# Patient Record
Sex: Male | Born: 2000 | Race: White | Hispanic: No | Marital: Single | Smoking: Never smoker
Health system: Southern US, Community
[De-identification: ages and names within clinical notes are randomized; demographics above are authoritative.]

## PROBLEM LIST (undated history)

## (undated) DIAGNOSIS — J302 Other seasonal allergic rhinitis: Secondary | ICD-10-CM

---
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
## 2019-02-25 ENCOUNTER — Other Ambulatory Visit: Payer: Self-pay

## 2019-02-25 ENCOUNTER — Ambulatory Visit (INDEPENDENT_AMBULATORY_CARE_PROVIDER_SITE_OTHER): Payer: Medicaid Other | Admitting: Family Medicine

## 2019-02-25 ENCOUNTER — Encounter: Payer: Self-pay | Admitting: Family Medicine

## 2019-02-25 DIAGNOSIS — L0501 Pilonidal cyst with abscess: Secondary | ICD-10-CM | POA: Diagnosis not present

## 2019-02-25 MED ORDER — AMOXICILLIN-POT CLAVULANATE 875-125 MG PO TABS: 1.0000 | ORAL_TABLET | Freq: Two times a day (BID) | ORAL | 0 refills | Status: DC

## 2019-02-25 NOTE — Patient Instructions (Signed)
Pilonidal Cyst  A pilonidal cyst is a fluid-filled sac that forms beneath the skin near the tailbone, at the top of the crease of the buttocks (pilonidal area). If the cyst is not large and not infected, it may not cause any problems. If the cyst becomes irritated or infected, it may get larger and fill with pus. An infected cyst is called an abscess. A pilonidal abscess may cause pain and swelling, and it may need to be drained or removed. What are the causes? The cause of this condition is not always known. In some cases, a hair that grows into your skin (ingrown hair) may be the cause. What increases the risk? You are more likely to get a pilonidal cyst if you:  Are male.  Have lots of hair near the crease of the buttocks.  Are overweight.  Have a dimple near the crease of the buttocks.  Wear tight clothing.  Do not bathe or shower often.  Sit for long periods of time. What are the signs or symptoms? Signs and symptoms of a pilonidal cyst may include pain, swelling, redness, and warmth in the pilonidal area. Depending on how big the cyst is, you may be able to feel a lump near your tailbone. If your cyst becomes infected, symptoms may include:  Pus or fluid drainage.  Fever.  Pain, swelling, and redness getting worse.  The lump getting bigger. How is this diagnosed? This condition may be diagnosed based on:  Your symptoms and medical history.  A physical exam.  A blood test to check for infection.  Testing a pus sample, if applicable. How is this treated? If your cyst does not cause symptoms, you may not need any treatment. If your cyst bothers you or is infected, you may need a procedure to drain or remove the cyst. Depending on the size, location, and severity of your cyst, your health care provider may:  Make an incision in the cyst and drain it (incision and drainage).  Open and drain the cyst, and then stitch the wound so that it stays open while it heals  (marsupialization). You will be given instructions about how to care for your open wound while it heals.  Remove all or part of the cyst, and then close the wound (cyst removal). You may need to take antibiotic medicines before your procedure. Follow these instructions at home: Medicines  Take over-the-counter and prescription medicines only as told by your health care provider.  If you were prescribed an antibiotic medicine, take it as told by your health care provider. Do not stop taking the antibiotic even if you start to feel better. General instructions  Keep the area around your pilonidal cyst clean and dry.  If there is fluid or pus draining from your cyst: ? Cover the area with a clean bandage (dressing) as needed. ? Wash the area gently with soap and water. Pat the area dry with a clean towel. Do not rub the area because that may cause bleeding.  Remove hair from the area around the cyst only if your health care provider tells you to do this.  Do not wear tight pants or sit in one position for long periods at a time.  Keep all follow-up visits as told by your health care provider. This is important. Contact a health care provider if you have:  New redness, swelling, or pain.  A fever.  Severe pain. Summary  A pilonidal cyst is a fluid-filled sac that forms beneath the   skin near the tailbone, at the top of the crease of the buttocks (pilonidal area).  If the cyst becomes irritated or infected, it may get larger and fill with pus. An infected cyst is called an abscess.  The cause of this condition is not always known. In some cases, a hair that grows into your skin (ingrown hair) may be the cause.  If your cyst does not cause symptoms, you may not need any treatment. If your cyst bothers you or is infected, you may need a procedure to drain or remove the cyst. This information is not intended to replace advice given to you by your health care provider. Make sure you  discuss any questions you have with your health care provider. Document Released: 06/10/2000 Document Revised: 06/01/2017 Document Reviewed: 06/01/2017 Elsevier Patient Education  2020 Elsevier Inc.  

## 2019-02-25 NOTE — Progress Notes (Signed)
   Subjective:    Patient ID: Anthony Wu, male    DOB: Jun 26, 2001, 18 y.o.   MRN: [id number] Initially virtual visit After discussion on the phone it was apparent the patient would need an office visit to help address this HPI  Patient calls with cysts at tailbone irritated for several days-not large in size per patient. Patient states it has become painful and hurts to sit on it.  Patient states this area has become gradually larger more painful and discomforting.  He states it has had a little bit of drainage earlier today.  He is never had this before.  Denies any fever chills sweats denies any COVID symptoms Virtual Visit via Video Note  I connected with Anthony Wu on 02/25/19 at 11:00 AM EDT by a video enabled telemedicine application and verified that I am speaking with the correct person using two identifiers.  Location: Patient: home Provider: office   I discussed the limitations of evaluation and management by telemedicine and the availability of in person appointments. The patient expressed understanding and agreed to proceed.  History of Present Illness:    Observations/Objective:   Assessment and Plan:   Follow Up Instructions:    I discussed the assessment and treatment plan with the patient. The patient was provided an opportunity to ask questions and all were answered. The patient agreed with the plan and demonstrated an understanding of the instructions.   The patient was advised to call back or seek an in-person evaluation if the symptoms worsen or if the condition fails to improve as anticipated.  I provided 25 minutes of non-face-to-face time during this encounter.        Review of Systems  Constitutional: Negative for activity change.  HENT: Negative for congestion and rhinorrhea.   Respiratory: Negative for cough and shortness of breath.   Cardiovascular: Negative for chest pain.  Gastrointestinal: Negative for abdominal pain, diarrhea,  nausea and vomiting.  Genitourinary: Negative for dysuria and hematuria.  Neurological: Negative for weakness and headaches.  Psychiatric/Behavioral: Negative for behavioral problems and confusion.   Complains of pain in the upper tailbone region    Objective:   Physical Exam Lungs are clear respiratory rate normal heart regular no murmurs abdomen soft no guarding or rebound Lower tailbone region has pilonidal cyst with small area of drainage  This was discussed with the patient he agrees and gives verbal consent to drainage of this  The area was anesthetized with 1% lidocaine with epinephrine Area was prepped with Betadine #11 blade was used Approximately 1 cm cut was made into the area that had fluctuance with a small drainage hole A large amount of pus was obtained No bleeding complications Packing was placed with a bandage       Assessment & Plan:  Pilonidal cyst Drainage was achieved with I&D today Augmentin 875 mg twice daily for the next 10 days Will work toward getting patient in with surgeon Warning signs discussed if high fevers progressive symptoms are worse follow-up immediately

## 2019-02-26 ENCOUNTER — Other Ambulatory Visit: Payer: Self-pay

## 2019-02-26 ENCOUNTER — Encounter (HOSPITAL_COMMUNITY): Payer: Self-pay | Admitting: *Deleted

## 2019-02-26 ENCOUNTER — Ambulatory Visit: Payer: Medicaid Other | Admitting: Family Medicine

## 2019-02-26 ENCOUNTER — Emergency Department (HOSPITAL_COMMUNITY)
Admission: EM | Admit: 2019-02-26 | Discharge: 2019-02-26 | Disposition: A | Discharge status: Home / Self Care | Payer: Medicaid Other | Attending: Emergency Medicine | Admitting: Emergency Medicine

## 2019-02-26 ENCOUNTER — Emergency Department (HOSPITAL_COMMUNITY): Payer: Medicaid Other

## 2019-02-26 ENCOUNTER — Telehealth: Payer: Self-pay | Admitting: Family Medicine

## 2019-02-26 DIAGNOSIS — J988 Other specified respiratory disorders: Secondary | ICD-10-CM | POA: Diagnosis not present

## 2019-02-26 DIAGNOSIS — Z7722 Contact with and (suspected) exposure to environmental tobacco smoke (acute) (chronic): Secondary | ICD-10-CM | POA: Diagnosis not present

## 2019-02-26 DIAGNOSIS — R509 Fever, unspecified: Secondary | ICD-10-CM | POA: Diagnosis not present

## 2019-02-26 DIAGNOSIS — Z79899 Other long term (current) drug therapy: Secondary | ICD-10-CM | POA: Diagnosis not present

## 2019-02-26 DIAGNOSIS — B9789 Other viral agents as the cause of diseases classified elsewhere: Secondary | ICD-10-CM | POA: Diagnosis not present

## 2019-02-26 DIAGNOSIS — J069 Acute upper respiratory infection, unspecified: Secondary | ICD-10-CM | POA: Diagnosis not present

## 2019-02-26 DIAGNOSIS — Z20828 Contact with and (suspected) exposure to other viral communicable diseases: Secondary | ICD-10-CM | POA: Insufficient documentation

## 2019-02-26 DIAGNOSIS — R05 Cough: Secondary | ICD-10-CM | POA: Diagnosis not present

## 2019-02-26 DIAGNOSIS — L0501 Pilonidal cyst with abscess: Secondary | ICD-10-CM | POA: Diagnosis not present

## 2019-02-26 LAB — CBC WITH DIFFERENTIAL/PLATELET
Abs Immature Granulocytes: 0.03 10*3/uL (ref 0.00–0.07)
Basophils Absolute: 0.1 10*3/uL (ref 0.0–0.1)
Basophils Relative: 0 %
Eosinophils Absolute: 0 10*3/uL (ref 0.0–0.5)
Eosinophils Relative: 0 %
HCT: 45.3 % (ref 39.0–52.0)
Hemoglobin: 15 g/dL (ref 13.0–17.0)
Immature Granulocytes: 0 %
Lymphocytes Relative: 14 %
Lymphs Abs: 1.9 10*3/uL (ref 0.7–4.0)
MCH: 30.2 pg (ref 26.0–34.0)
MCHC: 33.1 g/dL (ref 30.0–36.0)
MCV: 91.3 fL (ref 80.0–100.0)
Monocytes Absolute: 1.4 10*3/uL — ABNORMAL HIGH (ref 0.1–1.0)
Monocytes Relative: 11 %
Neutro Abs: 10.2 10*3/uL — ABNORMAL HIGH (ref 1.7–7.7)
Neutrophils Relative %: 75 %
Platelets: 261 10*3/uL (ref 150–400)
RBC: 4.96 MIL/uL (ref 4.22–5.81)
RDW: 12.5 % (ref 11.5–15.5)
WBC: 13.7 10*3/uL — ABNORMAL HIGH (ref 4.0–10.5)
nRBC: 0 % (ref 0.0–0.2)

## 2019-02-26 LAB — SARS CORONAVIRUS 2 BY RT PCR (HOSPITAL ORDER, PERFORMED IN ~~LOC~~ HOSPITAL LAB): SARS Coronavirus 2: NEGATIVE

## 2019-02-26 LAB — BASIC METABOLIC PANEL
Anion gap: 12 (ref 5–15)
BUN: 9 mg/dL (ref 6–20)
CO2: 24 mmol/L (ref 22–32)
Calcium: 9.1 mg/dL (ref 8.9–10.3)
Chloride: 101 mmol/L (ref 98–111)
Creatinine, Ser: 1.25 mg/dL — ABNORMAL HIGH (ref 0.61–1.24)
GFR calc Af Amer: >60 mL/min (ref >60)
GFR calc non Af Amer: >60 mL/min (ref >60)
Glucose, Bld: 92 mg/dL (ref 70–99)
Potassium: 3.7 mmol/L (ref 3.5–5.1)
Sodium: 137 mmol/L (ref 135–145)

## 2019-02-26 LAB — LACTIC ACID, PLASMA: Lactic Acid, Venous: 1.1 mmol/L (ref 0.5–1.9)

## 2019-02-26 MED ORDER — ACETAMINOPHEN 325 MG PO TABS
650.0000 mg | ORAL_TABLET | Freq: Once | ORAL | Status: AC
  Administered 2019-02-26: 650 mg via ORAL
  Filled 2019-02-26: qty 2

## 2019-02-26 MED ORDER — SODIUM CHLORIDE 0.9 % IV BOLUS
1000.0000 mL | Freq: Once | INTRAVENOUS | Status: AC
  Administered 2019-02-26: 1000 mL via INTRAVENOUS

## 2019-02-26 MED ORDER — AZELASTINE HCL 0.1 % NA SOLN: 2.0000 | Freq: Two times a day (BID) | NASAL | 2 refills | Status: DC

## 2019-02-26 NOTE — ED Triage Notes (Signed)
To ED for eval of fever after having an abscess drained (coccyx) yesterday. Today woke with high fever. Told to come to ED for further eval. Pt states area feels much better.

## 2019-02-26 NOTE — Telephone Encounter (Signed)
Patient relates pilonidal cyst feels better Still draining some pus Today start running 102 fever Slight cough Based on the symptoms along with recent drainage of pilonidal cyst referral to [facility] ER Needs evaluation for bacteremia may need IV antibiotics It is also possible this could be early COVID causing the fever may well need testing as well  I did talk with the patient he voices understanding his mother will drive him to the ER for further evaluation

## 2019-02-26 NOTE — Discharge Instructions (Signed)
Please follow-up with your doctor tomorrow for wound check and recheck of your symptoms.  Please return to the emergency department if you develop any new or worsening symptoms including worsening pain, increasing size of your abscess, passing out, difficulty breathing, or any other new or concerning symptoms.

## 2019-02-26 NOTE — Telephone Encounter (Signed)
Refill sent in to requested pharmacy 

## 2019-02-26 NOTE — Telephone Encounter (Signed)
Please see previous documentation on previous phone message  Nurse did call triage at [facility] to let them know patient coming because of 102 fever with recently drained pilonidal cyst as well as possibility of COVID

## 2019-02-26 NOTE — Telephone Encounter (Signed)
Mom contacted office to inform us that pt is now running a fever or 102.3. pt had a boil lanced yesterday. The area is draining; dark brown in color with a little blood. The smell and pain is not as bad as yesterday. Spoke with Dr.Scott; per Dr.Scott put a message in and I will give them a call in about 235-30 minutes. Mom verbalized understanding.

## 2019-02-26 NOTE — Telephone Encounter (Signed)
Patients dad is calling requesting a refill on the azelastine.   Gordon

## 2019-02-26 NOTE — Telephone Encounter (Signed)
I did speak with the mother regarding this and the patient they are going to [facility] ER They will need evaluation for bacteremia Outside chance of developing COVID since young man is started to have a cough today ER will evaluate for both Triage the ER was spoken with by nurse Additional records are within the system

## 2019-02-26 NOTE — ED Provider Notes (Signed)
[facility] EMERGENCY DEPARTMENT Provider Note   CSN: [id number] Arrival date & time: 02/26/19  1558     History   Chief Complaint Chief Complaint  Patient presents with  . Fever    HPI Anthony Wu is a 18 y.o. male.     HPI   18 year old male presents today with complaints of fever.  Patient notes that approximately 4 days ago he developed a pilonidal abscess.  He was seen by his primary care provider who performed I&D in the clinic.  The wound was packed he was placed on antibiotics and sent home.  He notes that today he developed a fever, he called his primary care provider who recommended he come to the emergency room for ongoing evaluation and management.  Patient notes that he also developed a very minor cough yesterday with no associated shortness of breath.  He denies any abdominal pain any rashes, or any other signs of infectious etiology.  He denies any close sick contacts or covert exposures.    Past Medical History:  Diagnosis Date  . Seasonal allergies     Patient Active Problem List   Diagnosis Date Noted  . Allergic rhinitis 10/24/2012    History reviewed. No pertinent surgical history.      Home Medications    Prior to Admission medications   Medication Sig Start Date End Date Taking? Authorizing Provider  amoxicillin-clavulanate (AUGMENTIN) 875-125 MG tablet Take 1 tablet by mouth 2 (two) times daily. 02/25/19   Kathyrn Drown, MD  azelastine (ASTELIN) 0.1 % nasal spray Place 2 sprays into both nostrils 2 (two) times daily. 02/26/19   Valentina Shaggy, MD  cetirizine (ZYRTEC) 10 MG tablet Take 1 tablet (10 mg total) by mouth daily. 01/16/18   Valentina Shaggy, MD  fluticasone [facility]) 50 MCG/ACT nasal spray Place 2 sprays into both nostrils daily. 10/09/17   Kathyrn Drown, MD  montelukast (SINGULAIR) 10 MG tablet Take 1 tablet (10 mg total) by mouth at bedtime. 05/09/18   Valentina Shaggy, MD    Family History  Family History  Problem Relation Age of Onset  . Allergic rhinitis Father   . COPD Father     Social History Social History   Tobacco Use  . Smoking status: Passive Smoke Exposure - Never Smoker  . Smokeless tobacco: Never Used  Substance Use Topics  . Alcohol use: No  . Drug use: No     Allergies   Patient has no known allergies.   Review of Systems Review of Systems  All other systems reviewed and are negative.    Physical Exam Updated Vital Signs BP 130/71 (BP Location: Left Arm)   Pulse 100   Temp (!) 100.6 F (38.1 C) (Oral)   Resp 18   SpO2 97%   Physical Exam Vitals signs and nursing note reviewed.  Constitutional:      Appearance: He is well-developed.  HENT:     Head: Normocephalic and atraumatic.  Eyes:     General: No scleral icterus.       Right eye: No discharge.        Left eye: No discharge.     Conjunctiva/sclera: Conjunctivae normal.     Pupils: Pupils are equal, round, and reactive to light.  Neck:     Musculoskeletal: Normal range of motion.     Vascular: No JVD.     Trachea: No tracheal deviation.  Pulmonary:     Effort: Pulmonary effort is  normal.     Breath sounds: No stridor.  Abdominal:     General: Abdomen is flat. There is no distension.     Palpations: Abdomen is soft.     Tenderness: There is no abdominal tenderness.  Skin:    Comments: 1.5 cm area of induration to the right pilonidal region with packing in place no surrounding erythema no significant tenderness  Neurological:     Mental Status: He is alert and oriented to person, place, and time.     Coordination: Coordination normal.  Psychiatric:        Behavior: Behavior normal.        Thought Content: Thought content normal.        Judgment: Judgment normal.      ED Treatments / Results  Labs (all labs ordered are listed, but only abnormal results are displayed) Labs Reviewed  CBC WITH DIFFERENTIAL/PLATELET - Abnormal; Notable for the following components:       Result Value   WBC 13.7 (*)    Neutro Abs 10.2 (*)    Monocytes Absolute 1.4 (*)    All other components within normal limits  SARS CORONAVIRUS 2 (HOSPITAL ORDER, PERFORMED IN [facility])  CULTURE, BLOOD (ROUTINE X 2)  CULTURE, BLOOD (ROUTINE X 2)  LACTIC ACID, PLASMA  BASIC METABOLIC PANEL  LACTIC ACID, PLASMA    EKG None  Radiology No results found.  Procedures Procedures (including critical care time)  Medications Ordered in ED Medications  acetaminophen (TYLENOL) tablet 650 mg (650 mg Oral Given 02/26/19 1838)  sodium chloride 0.9 % bolus 1,000 mL (1,000 mLs Intravenous New Bag/Given 02/26/19 1838)     Initial Impression / Assessment and Plan / ED Course  I have reviewed the triage vital signs and the nursing notes.  Pertinent labs & imaging results that were available during my care of the patient were reviewed by me and considered in my medical decision making (see chart for details).        18 year old male presents today with fever.  Differential for his fever includes COVID given his new cough elevated fever and overall well appearance.  Also consider bacteremia secondary to pilonidal abscess.  Although I believe the bacteremia is unlikely the source given the very minimal presentation today it is still within the differential.  Given his elevated fever and tachycardia he will be given fluids, he will have a Covid test here.  Patient care signed to oncoming provider pending laboratory analysis COVID testing and disposition.    Final Clinical Impressions(s) / ED Diagnoses   Final diagnoses:  Pilonidal abscess  Viral respiratory illness    ED Discharge Orders    None       Rosalio LoudHedges, Lynnsey Barbara, PA-C 02/26/19 1853    Tegeler, Canary Brimhristopher J, MD 02/27/19 [phone]

## 2019-02-26 NOTE — ED Notes (Signed)
Patient verbalizes understanding of discharge instructions. Opportunity for questioning and answers were provided. Armband removed by staff, pt discharged from ED.  

## 2019-02-26 NOTE — ED Provider Notes (Signed)
Signout from [employer], PA-C at shift change See previous providers note for full H&P  Briefly, patient was seen by his PCP yesterday and had incision and drainage of pilonidal abscess.  He was started on Augmentin.  Patient spiked a fever today and also developed a cough.  He is otherwise feeling pretty well.  He is having some discomfort at the site of the abscess, however it is improving.  Physical Exam  BP 112/61   Pulse 90   Temp (!) 100.6 F (38.1 C) (Oral)   Resp 18   SpO2 99%   Physical Exam Vitals signs and nursing note reviewed.  Constitutional:      General: He is not in acute distress.    Appearance: He is well-developed. He is not diaphoretic.  HENT:     Head: Normocephalic and atraumatic.     Mouth/Throat:     Pharynx: No oropharyngeal exudate.  Eyes:     General: No scleral icterus.       Right eye: No discharge.        Left eye: No discharge.     Conjunctiva/sclera: Conjunctivae normal.     Pupils: Pupils are equal, round, and reactive to light.  Neck:     Musculoskeletal: Normal range of motion and neck supple.     Thyroid: No thyromegaly.  Cardiovascular:     Rate and Rhythm: Normal rate and regular rhythm.     Heart sounds: Normal heart sounds. No murmur. No friction rub. No gallop.   Pulmonary:     Effort: Pulmonary effort is normal. No respiratory distress.     Breath sounds: Normal breath sounds. No stridor. No wheezing or rales.  Abdominal:     General: Bowel sounds are normal. There is no distension.     Palpations: Abdomen is soft.     Tenderness: There is no abdominal tenderness. There is no guarding or rebound.  Lymphadenopathy:     Cervical: No cervical adenopathy.  Skin:    General: Skin is warm and dry.     Coloration: Skin is not pale.     Findings: No rash.     Comments: Incision and drainage site in the pilonidal region with packing; 3 cm area of erythema and induration, mildly tender  Neurological:     Mental Status: He is alert.      Coordination: Coordination normal.     ED Course/Procedures     Procedures  MDM  Patient presenting with fever, cough, and recently drained pilonidal abscess.  Patient does have leukocytosis at 13.7.  Creatinine is elevated at 1.25.  IV fluids given.  Lactic acid is negative.  Blood cultures pending.  COVID-19 test is negative as well as chest x-ray.  Using shared decision making I discussed with patient and his mother the options of close follow-up with PCP versus hospital admission.  Patient would like to go home.  I feel this is reasonable.  He has an appointment with his doctor tomorrow for wound recheck and packing removal.  He is given strict return precautions and advised to increase fluid intake at home.  He and his mother understand and agree with plan.  Patient vital stable throughout ED course and discharged in satisfactory condition. I discussed patient case with Dr. Vanita Panda who guided the patient's management and agrees with plan.        Frederica Kuster, PA-C 02/27/19 1424    Carmin Muskrat, MD 03/01/19 [phone]

## 2019-02-27 ENCOUNTER — Telehealth: Payer: Self-pay | Admitting: Family Medicine

## 2019-02-27 DIAGNOSIS — L0501 Pilonidal cyst with abscess: Secondary | ICD-10-CM

## 2019-02-27 NOTE — Telephone Encounter (Signed)
Pt will need urgent referral before getting an appt.  Dad states that pt is doing a little better today. He is still having a small fever (100 this morning but Dad not sure if thermometer is accurate); pt vomited last night; wound is still draining some. COVID and blood test came back negative but are still waiting on a few blood test that are still running. Please advise. Thank you

## 2019-02-27 NOTE — Telephone Encounter (Signed)
Please give urgent referral may have appointment for next week I would recommend toward the end of next week  Continue to do warm compresses Nurses-I recommend that I do a follow-up visit with the patient tomorrow morning-I have limited hours- with patient running fever I would have to do the visit at outside facility  If unable to come tomorrow morning then I would recommend Friday morning

## 2019-02-27 NOTE — Telephone Encounter (Signed)
Please advise. Thank you

## 2019-02-27 NOTE — Telephone Encounter (Signed)
Dad calling to check on referral to surgeon.  He said test came back negative at ER and the hospital said to continue on with the referral.  I don't see a referral in the system.

## 2019-02-27 NOTE — Telephone Encounter (Signed)
Urgent referral placed to general surgery. Left message to return call

## 2019-02-27 NOTE — Telephone Encounter (Signed)
Please call over to Dr. Fransisca Kaufmann office please give patient a follow-up visit regarding pilonidal cyst  Ideally I would like to have the patient seen this coming week September 8-11 See how the young man is doing currently?  Any fevers today?

## 2019-02-28 ENCOUNTER — Ambulatory Visit (INDEPENDENT_AMBULATORY_CARE_PROVIDER_SITE_OTHER): Payer: Medicaid Other | Admitting: Family Medicine

## 2019-02-28 ENCOUNTER — Other Ambulatory Visit: Payer: Self-pay

## 2019-02-28 DIAGNOSIS — J019 Acute sinusitis, unspecified: Secondary | ICD-10-CM

## 2019-02-28 DIAGNOSIS — Z20822 Contact with and (suspected) exposure to covid-19: Secondary | ICD-10-CM

## 2019-02-28 DIAGNOSIS — R6889 Other general symptoms and signs: Secondary | ICD-10-CM | POA: Diagnosis not present

## 2019-02-28 MED ORDER — ONDANSETRON HCL 8 MG PO TABS: 8.0000 mg | ORAL_TABLET | Freq: Three times a day (TID) | ORAL | 0 refills | Status: DC | PRN

## 2019-02-28 MED ORDER — AZITHROMYCIN 250 MG PO TABS: ORAL_TABLET | ORAL | 0 refills | Status: DC

## 2019-02-28 NOTE — Telephone Encounter (Signed)
Contacted dad. Dad states that patient is still having a fever, chills, diarrhea, and has vomited some. Pt dad transferred up front to set up appt for this morning

## 2019-02-28 NOTE — Progress Notes (Signed)
Patient with significant body aches fever chills sweats cough congestion denies shortness of breath Patient relates diarrhea some nausea no vomiting Intermittent vomiting yesterday last night Able to urinate Does not feel like he is going to pass out Went to the ER the other day because of the fever negative COVID test Packing needs to be removed today On examination lungs are clear heart is regular pulse normal temperature 103 not in respiratory distress distress skin warm dry skin turgor good no sign of any type of vascular compromise at this point Probable COVID infection Z-Pak for sinus symptoms as well as pilonidal cyst Stop Augmentin could be causing some of his GI symptoms Patient to stay isolated for 10 days Recommend repeat COVID testing 15 minutes was spent with patient today discussing healthcare issues which they came.  More than 50% of this visit-total duration of visit-was spent in counseling and coordination of care.  Please see diagnosis regarding the focus of this coordination and care Packing was removed without difficulty pilonidal cyst looks good Proper PPE was worn during this evaluation For safety purposes of other patients outdoor facility was utilized

## 2019-03-01 LAB — NOVEL CORONAVIRUS, NAA: SARS-CoV-2, NAA: NOT DETECTED

## 2019-03-03 LAB — CULTURE, BLOOD (ROUTINE X 2): Culture: NO GROWTH

## 2019-03-19 ENCOUNTER — Ambulatory Visit (INDEPENDENT_AMBULATORY_CARE_PROVIDER_SITE_OTHER): Payer: Medicaid Other | Admitting: General Surgery

## 2019-03-19 ENCOUNTER — Other Ambulatory Visit: Payer: Self-pay

## 2019-03-19 ENCOUNTER — Encounter: Payer: Self-pay | Admitting: General Surgery

## 2019-03-19 VITALS — BP 158/84 | HR 99 | Temp 99.1°F | Resp 16 | Ht 66.0 in | Wt 244.0 lb

## 2019-03-19 DIAGNOSIS — L0591 Pilonidal cyst without abscess: Secondary | ICD-10-CM | POA: Diagnosis not present

## 2019-03-19 NOTE — Patient Instructions (Signed)
Pilonidal Cyst  A pilonidal cyst is a fluid-filled sac that forms beneath the skin near the tailbone, at the top of the crease of the buttocks (pilonidal area). If the cyst is not large and not infected, it may not cause any problems. If the cyst becomes irritated or infected, it may get larger and fill with pus. An infected cyst is called an abscess. A pilonidal abscess may cause pain and swelling, and it may need to be drained or removed. What are the causes? The cause of this condition is not always known. In some cases, a hair that grows into your skin (ingrown hair) may be the cause. What increases the risk? You are more likely to get a pilonidal cyst if you:  Are male.  Have lots of hair near the crease of the buttocks.  Are overweight.  Have a dimple near the crease of the buttocks.  Wear tight clothing.  Do not bathe or shower often.  Sit for long periods of time. What are the signs or symptoms? Signs and symptoms of a pilonidal cyst may include pain, swelling, redness, and warmth in the pilonidal area. Depending on how big the cyst is, you may be able to feel a lump near your tailbone. If your cyst becomes infected, symptoms may include:  Pus or fluid drainage.  Fever.  Pain, swelling, and redness getting worse.  The lump getting bigger. How is this diagnosed? This condition may be diagnosed based on:  Your symptoms and medical history.  A physical exam.  A blood test to check for infection.  Testing a pus sample, if applicable. How is this treated? If your cyst does not cause symptoms, you may not need any treatment. If your cyst bothers you or is infected, you may need a procedure to drain or remove the cyst. Depending on the size, location, and severity of your cyst, your health care provider may:  Make an incision in the cyst and drain it (incision and drainage).  Open and drain the cyst, and then stitch the wound so that it stays open while it heals  (marsupialization). You will be given instructions about how to care for your open wound while it heals.  Remove all or part of the cyst, and then close the wound (cyst removal). You may need to take antibiotic medicines before your procedure. Follow these instructions at home: Medicines  Take over-the-counter and prescription medicines only as told by your health care provider.  If you were prescribed an antibiotic medicine, take it as told by your health care provider. Do not stop taking the antibiotic even if you start to feel better. General instructions  Keep the area around your pilonidal cyst clean and dry.  If there is fluid or pus draining from your cyst: ? Cover the area with a clean bandage (dressing) as needed. ? Wash the area gently with soap and water. Pat the area dry with a clean towel. Do not rub the area because that may cause bleeding.  Remove hair from the area around the cyst only if your health care provider tells you to do this.  Do not wear tight pants or sit in one position for long periods at a time.  Keep all follow-up visits as told by your health care provider. This is important. Contact a health care provider if you have:  New redness, swelling, or pain.  A fever.  Severe pain. Summary  A pilonidal cyst is a fluid-filled sac that forms beneath the   skin near the tailbone, at the top of the crease of the buttocks (pilonidal area).  If the cyst becomes irritated or infected, it may get larger and fill with pus. An infected cyst is called an abscess.  The cause of this condition is not always known. In some cases, a hair that grows into your skin (ingrown hair) may be the cause.  If your cyst does not cause symptoms, you may not need any treatment. If your cyst bothers you or is infected, you may need a procedure to drain or remove the cyst. This information is not intended to replace advice given to you by your health care provider. Make sure you  discuss any questions you have with your health care provider. Document Released: 06/10/2000 Document Revised: 06/01/2017 Document Reviewed: 06/01/2017 Elsevier Patient Education  2020 Elsevier Inc.  

## 2019-03-20 NOTE — Progress Notes (Signed)
Anthony Wu; [id number]; [birth year]   HPI Patient is a 18 year old white male who was referred to my care by Sallee Lange for the pilonidal cyst.  The patient had an I&D of his pilonidal cyst in the recent past.  He was started on antibiotic.  His pilonidal cyst abscess has resolved and he has no pain or fever.  No drainage has been noted.  This was his first episode.  He has 0 out of 10 coccyx pain. Past Medical History:  Diagnosis Date  . Seasonal allergies     History reviewed. No pertinent surgical history.  Family History  Problem Relation Age of Onset  . Allergic rhinitis Father   . COPD Father     Current Outpatient Medications on File Prior to Visit  Medication Sig Dispense Refill  . azelastine (ASTELIN) 0.1 % nasal spray Place 2 sprays into both nostrils 2 (two) times daily. 30 mL 2  . azithromycin (ZITHROMAX Z-PAK) 250 MG tablet Take 2 tablets (500 mg) on  Day 1,  followed by 1 tablet (250 mg) once daily on Days 2 through 5. 6 each 0  . cetirizine (ZYRTEC) 10 MG tablet Take 1 tablet (10 mg total) by mouth daily. 30 tablet 2  . fluticasone (FLONASE) 50 MCG/ACT nasal spray Place 2 sprays into both nostrils daily. 16 g 2  . montelukast (SINGULAIR) 10 MG tablet Take 1 tablet (10 mg total) by mouth at bedtime. 30 tablet 5  . ondansetron (ZOFRAN) 8 MG tablet Take 1 tablet (8 mg total) by mouth every 8 (eight) hours as needed for nausea. 20 tablet 0   No current facility-administered medications on file prior to visit.     No Known Allergies  Social History   Substance and Sexual Activity  Alcohol Use No    Social History   Tobacco Use  Smoking Status Passive Smoke Exposure - Never Smoker  Smokeless Tobacco Never Used    Review of Systems  Constitutional: Negative.   HENT: Negative.   Eyes: Negative.   Respiratory: Negative.   Cardiovascular: Negative.   Gastrointestinal: Negative.   Genitourinary: Negative.   Musculoskeletal: Negative.   Skin: Negative.    Endo/Heme/Allergies: Negative.   Psychiatric/Behavioral: Negative.     Objective   Vitals:   03/19/19 1457  BP: (!) 158/84  Pulse: 99  Resp: 16  Temp: 99.1 F (37.3 C)  SpO2: 97%    Physical Exam Vitals signs reviewed.  Constitutional:      Appearance: Normal appearance. He is normal weight. He is not ill-appearing.  HENT:     Head: Normocephalic and atraumatic.  Cardiovascular:     Rate and Rhythm: Normal rate. Rhythm irregular.     Heart sounds: Normal heart sounds. No murmur. No friction rub. No gallop.   Pulmonary:     Effort: Pulmonary effort is normal. No respiratory distress.     Breath sounds: Normal breath sounds. No stridor. No wheezing, rhonchi or rales.  Skin:    General: Skin is warm and dry.     Comments: 4 punctate areas with some hair present along the midline of the coccyx.  No purulent drainage is noted.  No induration is noted.  Nontender to touch.  Neurological:     Mental Status: He is alert and oriented to person, place, and time.   ER note reviewed  Assessment  Pilonidal cyst, infection resolved Plan   No need for acute surgical invention at this time.  I told the patient to contact  me should he have a recurrence of infection.  Should this occur, we will then proceed with excision of the pilonidal cyst.  He understands and agrees.  Follow-up as needed.

## 2019-04-01 ENCOUNTER — Other Ambulatory Visit: Payer: Self-pay

## 2019-04-01 ENCOUNTER — Ambulatory Visit (INDEPENDENT_AMBULATORY_CARE_PROVIDER_SITE_OTHER): Payer: Medicaid Other | Admitting: Family Medicine

## 2019-04-01 DIAGNOSIS — J019 Acute sinusitis, unspecified: Secondary | ICD-10-CM

## 2019-04-01 MED ORDER — AMOXICILLIN-POT CLAVULANATE 875-125 MG PO TABS: 1.0000 | ORAL_TABLET | Freq: Two times a day (BID) | ORAL | 0 refills | Status: DC

## 2019-04-01 NOTE — Progress Notes (Signed)
   Subjective:    Patient ID: Anthony Wu, male    DOB: [birth year], 18 y.o.   MRN: [id number]  Sinusitis This is a new problem. Episode onset: 4 days. There has been no fever. Associated symptoms include congestion, coughing and a sore throat. Pertinent negatives include no chills or ear pain. Treatments tried: honey.  He had more of the classic symptoms of COVID back in early September when he had testing which was negative currently he just has a little bit of head congestion sore throat cough but denies body aches denies feeling fatigued tired or headache states he is coughing up some phlegm PMH benign Virtual Visit via Video Note  I connected with Anthony Wu on 04/01/19 at  4:10 PM EDT by a video enabled telemedicine application and verified that I am speaking with the correct person using two identifiers.  Location: Patient: home Provider: office   I discussed the limitations of evaluation and management by telemedicine and the availability of in person appointments. The patient expressed understanding and agreed to proceed.  History of Present Illness:    Observations/Objective:   Assessment and Plan:   Follow Up Instructions:    I discussed the assessment and treatment plan with the patient. The patient was provided an opportunity to ask questions and all were answered. The patient agreed with the plan and demonstrated an understanding of the instructions.   The patient was advised to call back or seek an in-person evaluation if the symptoms worsen or if the condition fails to improve as anticipated.  I provided 15 minutes of non-face-to-face time during this encounter.       Review of Systems  Constitutional: Negative for activity change, chills and fever.  HENT: Positive for congestion, rhinorrhea and sore throat. Negative for ear pain.   Eyes: Negative for discharge.  Respiratory: Positive for cough. Negative for wheezing.   Cardiovascular: Negative  for chest pain.  Gastrointestinal: Negative for nausea and vomiting.  Musculoskeletal: Negative for arthralgias.       Objective:   Physical Exam   Patient had virtual visit Appears to be in no distress Atraumatic Neuro able to relate and oriented No apparent resp distress Color normal      Assessment & Plan:  Viral illness with possible r secondary sinusitis go ahead with antibiotics warning signs discussed follow-up if progressive troubles

## 2019-05-08 ENCOUNTER — Ambulatory Visit (INDEPENDENT_AMBULATORY_CARE_PROVIDER_SITE_OTHER): Payer: Medicaid Other | Admitting: Allergy & Immunology

## 2019-05-08 ENCOUNTER — Encounter: Payer: Self-pay | Admitting: Allergy & Immunology

## 2019-05-08 ENCOUNTER — Other Ambulatory Visit: Payer: Self-pay

## 2019-05-08 VITALS — BP 132/84 | HR 100 | Temp 98.0°F | Resp 18 | Ht 69.4 in | Wt 245.8 lb

## 2019-05-08 DIAGNOSIS — J3089 Other allergic rhinitis: Secondary | ICD-10-CM | POA: Diagnosis not present

## 2019-05-08 DIAGNOSIS — J302 Other seasonal allergic rhinitis: Secondary | ICD-10-CM | POA: Diagnosis not present

## 2019-05-08 MED ORDER — AZELASTINE HCL 0.1 % NA SOLN: 2.0000 | Freq: Two times a day (BID) | NASAL | 11 refills | Status: DC

## 2019-05-08 MED ORDER — MONTELUKAST SODIUM 10 MG PO TABS: 10.0000 mg | ORAL_TABLET | Freq: Every day | ORAL | 5 refills | Status: DC

## 2019-05-08 MED ORDER — CETIRIZINE HCL 10 MG PO TABS: 10.0000 mg | ORAL_TABLET | Freq: Every day | ORAL | 5 refills | Status: DC

## 2019-05-08 NOTE — Progress Notes (Signed)
FOLLOW UP  Date of Service/Encounter:  05/08/19   Assessment:   Seasonal and perennial allergic rhinitis (trees, weeds, grasses, indoor molds and outdoor molds) - well-controlled with Astelin daily   Plan/Recommendations:   1. Seasonal and perennial allergic rhinitis (trees, weeds, grasses, indoor molds and outdoor molds) - Continue with the azelastine daily. - Continue with the as needed use of your other medications: Singulair (montelukast) 10mg  daily, Zyrtec (cetirizine) 10mg   2. Return in about 1 year (around 05/07/2020). This can be an in-person, a virtual Webex or a telephone follow up visit.   Subjective:   Anthony Wu is a 18 y.o. male presenting today for follow up of  Chief Complaint  Patient presents with  . Allergies    Doing better    Anthony Wu has a history of the following: Patient Active Problem List   Diagnosis Date Noted  . Allergic rhinitis 10/24/2012    History obtained from: chart review and patient.  Anthony Wu is a 18 y.o. male presenting for a follow up visit.  He was last seen in November 2019.  At that time, we recommended stopping all of the nose sprays and antihistamines during the winter however but continuing with Singulair.  We recommended that he restart these in early March to have it on board with the spring starts.  Since last visit, he has done well.  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 He is going to finish 1 more year at the community college before transferring to [employer].   Allergic Rhinitis Symptom History: He is taking azelastine for the most part. this has been working wonders. He is on Singulair but he has not been using that as regularly. He is not using the cetirizine regularly either. He has not needed any antibiotics.  Otherwise, there have been no changes to his past medical history, surgical history, family history, or social history.    Review of Systems  Constitutional: Negative.   Negative for chills, fever, malaise/fatigue and weight loss.  HENT: Negative.  Negative for congestion, ear discharge, ear pain and sore throat.   Eyes: Negative for pain, discharge and redness.  Respiratory: Negative for cough, sputum production, shortness of breath and wheezing.   Cardiovascular: Negative.  Negative for chest pain and palpitations.  Gastrointestinal: Negative for abdominal pain, heartburn, nausea and vomiting.  Skin: Negative.  Negative for itching and rash.  Neurological: Negative for dizziness and headaches.  Endo/Heme/Allergies: Negative for environmental allergies. Does not bruise/bleed easily.       Objective:   Blood pressure 132/84, pulse 100, temperature 98 F (36.7 C), temperature source Temporal, resp. rate 18, height 5' 9.4" (1.763 m), weight 245 lb 12.8 oz (111.5 kg), SpO2 95 %. Body mass index is 35.88 kg/m.   Physical Exam:  Physical Exam  Constitutional: He appears well-developed.  Pleasant well mannered male.  HENT:  Head: Normocephalic and atraumatic.  Right Ear: Tympanic membrane, external ear and ear canal normal.  Left Ear: Tympanic membrane, external ear and ear canal normal.  Nose: Mucosal edema and rhinorrhea present. No nasal deformity or septal deviation. No epistaxis. Right sinus exhibits no maxillary sinus tenderness and no frontal sinus tenderness. Left sinus exhibits no maxillary sinus tenderness and no frontal sinus tenderness.  Mouth/Throat: Uvula is midline and oropharynx is clear and moist. Mucous membranes are not pale and not dry.  Tonsils 2+ bilaterally without discharge.  Eyes: Pupils are equal, round, and reactive to light. Conjunctivae and EOM  are normal. Right eye exhibits no chemosis and no discharge. Left eye exhibits no chemosis and no discharge. Right conjunctiva is not injected. Left conjunctiva is not injected.  Cardiovascular: Normal rate, regular rhythm and normal heart sounds.  Respiratory: Effort normal and breath  sounds normal. No accessory muscle usage. No tachypnea. No respiratory distress. He has no wheezes. He has no rhonchi. He has no rales. He exhibits no tenderness.  Moving air well in all lung fields.  Lymphadenopathy:    He has no cervical adenopathy.  Neurological: He is alert.  Skin: No abrasion, no petechiae and no rash noted. Rash is not papular, not vesicular and not urticarial. No erythema. No pallor.  Psychiatric: He has a normal mood and affect.     Diagnostic studies: none     Salvatore Marvel, MD  Allergy and [facility]

## 2019-05-08 NOTE — Patient Instructions (Addendum)
1. Seasonal and perennial allergic rhinitis (trees, weeds, grasses, indoor molds and outdoor molds) - Continue with the azelastine daily. - Continue with the as needed use of your other medications: Singulair (montelukast) 10mg  daily, Zyrtec (cetirizine) 10mg   2. Return in about 1 year (around 05/07/2020). This can be an in-person, a virtual Webex or a telephone follow up visit.   Please inform us of any Emergency Department visits, hospitalizations, or changes in symptoms. Call us before going to the ED for breathing or allergy symptoms since we might be able to fit you in for a sick visit. Feel free to contact us anytime with any questions, problems, or concerns.  It was a pleasure to see you again today! Good luck with your classes!   Websites that have reliable patient information: 1. American Academy of Asthma, Allergy, and Immunology: www.aaaai.org 2. Food Allergy Research and Education (FARE): foodallergy.org 3. Mothers of Asthmatics: http://www.asthmacommunitynetwork.org 4. American College of Allergy, Asthma, and Immunology: www.acaai.org  "Like" [place] on Facebook and Instagram for our latest updates!      Make sure you are registered to vote! If you have moved or changed any of your contact information, you will need to get this updated before voting!  In some cases, you MAY be able to register to vote online: [email or web address]

## 2019-05-10 ENCOUNTER — Ambulatory Visit: Payer: Medicaid Other | Admitting: Allergy & Immunology

## 2019-11-18 ENCOUNTER — Telehealth (INDEPENDENT_AMBULATORY_CARE_PROVIDER_SITE_OTHER): Payer: Medicaid Other | Admitting: Family Medicine

## 2019-11-18 ENCOUNTER — Other Ambulatory Visit: Payer: Self-pay

## 2019-11-18 ENCOUNTER — Encounter: Payer: Self-pay | Admitting: Family Medicine

## 2019-11-18 ENCOUNTER — Telehealth: Payer: Self-pay | Admitting: *Deleted

## 2019-11-18 DIAGNOSIS — J069 Acute upper respiratory infection, unspecified: Secondary | ICD-10-CM | POA: Diagnosis not present

## 2019-11-18 MED ORDER — PSEUDOEPH-BROMPHEN-DM 30-2-10 MG/5ML PO SYRP: 5.0000 mL | ORAL_SOLUTION | Freq: Four times a day (QID) | ORAL | 0 refills | Status: DC | PRN

## 2019-11-18 MED ORDER — IBUPROFEN 800 MG PO TABS: 800.0000 mg | ORAL_TABLET | Freq: Three times a day (TID) | ORAL | 0 refills | Status: DC | PRN

## 2019-11-18 MED ORDER — PSEUDOEPH-BROMPHEN-DM 30-2-10 MG/5ML PO SYRP: 10.0000 mL | ORAL_SOLUTION | Freq: Four times a day (QID) | ORAL | 0 refills | Status: DC | PRN

## 2019-11-18 NOTE — Progress Notes (Signed)
Patient ID: Anthony Wu, male    DOB: 2000-12-25, 19 y.o.   MRN: [id number]   Virtual Visit via phone Note  I connected with Anthony Wu on 11/18/19 at  3:30 PM EDT by a phone enabled telemedicine application and verified that I am speaking with the correct person using two identifiers.  Location: Patient: home Provider: office   I discussed the limitations of evaluation and management by telemedicine and the availability of in person appointments. The patient expressed understanding and agreed to proceed.  Chief Complaint  Patient presents with  . Cough    body aches and H/As since Thursday   Subjective:    HPI Pt called for phone visit for concern of URI. Pt having increase in coughing, dry.  Had a low grade temp today of 99-100F. Having bodyaches. Started 3-4 days ago. Having headaches. No sore throat, sneezing, nasal drainage, or ear pain. Last few days having dec activity and feeling ill.  Had to call out of work today. No known covid exposures.  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 otc meds- taking tylenol and sinus cold medication otc.   Medical History [place] has a past medical history of Seasonal allergies.   Outpatient Encounter Medications as of 11/18/2019  Medication Sig  . azelastine (ASTELIN) 0.1 % nasal spray Place 2 sprays into both nostrils 2 (two) times daily.  . brompheniramine-pseudoephedrine-DM 30-2-10 MG/5ML syrup Take 10 mLs by mouth 4 (four) times daily as needed (coughing).  . cetirizine (ZYRTEC) 10 MG tablet Take 1 tablet (10 mg total) by mouth daily.  Marland Kitchen ibuprofen (ADVIL) 800 MG tablet Take 1 tablet (800 mg total) by mouth every 8 (eight) hours as needed.  . montelukast (SINGULAIR) 10 MG tablet Take 1 tablet (10 mg total) by mouth at bedtime.  . [DISCONTINUED] brompheniramine-pseudoephedrine-DM 30-2-10 MG/5ML syrup Take 5 mLs by mouth 4 (four) times daily as needed (coughing).   No facility-administered  encounter medications on file as of 11/18/2019.     Review of Systems  Constitutional: Positive for activity change and fever. Negative for chills.  HENT: Negative for congestion, ear pain, mouth sores, postnasal drip, rhinorrhea, sinus pressure, sinus pain, sneezing and sore throat.   Eyes: Negative for pain, discharge and itching.  Respiratory: Positive for cough. Negative for shortness of breath and wheezing.   Gastrointestinal: Negative for diarrhea, nausea and vomiting.  Skin: Negative for rash.  Neurological: Positive for headaches.     Vitals There were no vitals taken for this visit.  Objective:   Physical Exam  No PE due to phone visit.  Assessment and Plan   1. Viral upper respiratory tract infection - ibuprofen (ADVIL) 800 MG tablet; Take 1 tablet (800 mg total) by mouth every 8 (eight) hours as needed.  Dispense: 45 tablet; Refill: 0 - brompheniramine-pseudoephedrine-DM 30-2-10 MG/5ML syrup; Take 10 mLs by mouth 4 (four) times daily as needed (coughing).  Dispense: 300 mL; Refill: 0   Likely viral URI syndrome- Gave bromophed for coughing/congestion. Take ibuprofen/ tylenol as needed for fever/body aches.  Advising to get covid testing.  Call with results and can given work note and instructions once results are back.  Advising quarantining till results back from covid testing. Symptomatic treatment given for fever and bodyaches with tylenol/ibuprofen and increase in fluids.   Call back if more SOB, coughing, persistent fever or not improving in next 5-7 days.   If positive for covid, requires quarantining for 10 days  from onset of symptoms.  Pt in agreement with plan.    F/u prn.    Follow Up Instructions:    I discussed the assessment and treatment plan with the patient. The patient was provided an opportunity to ask questions and all were answered. The patient agreed with the plan and demonstrated an understanding of the instructions.   The patient  was advised to call back or seek an in-person evaluation if the symptoms worsen or if the condition fails to improve as anticipated.  I provided 12 minutes of non-face-to-face time during this encounter.

## 2019-11-18 NOTE — Telephone Encounter (Signed)
Mr. Anthony Wu, Anthony Wu are scheduled for a virtual visit with your provider today.    Just as we do with appointments in the office, we must obtain your consent to participate.  Your consent will be active for this visit and any virtual visit you may have with one of our providers in the next 365 days.    If you have a MyChart account, I can also send a copy of this consent to you electronically.  All virtual visits are billed to your insurance company just like a traditional visit in the office.  As this is a virtual visit, video technology does not allow for your provider to perform a traditional examination.  This may limit your provider's ability to fully assess your condition.  If your provider identifies any concerns that need to be evaluated in person or the need to arrange testing such as labs, EKG, etc, we will make arrangements to do so.    Although advances in technology are sophisticated, we cannot ensure that it will always work on either your end or our end.  If the connection with a video visit is poor, we may have to switch to a telephone visit.  With either a video or telephone visit, we are not always able to ensure that we have a secure connection.   I need to obtain your verbal consent now.   Are you willing to proceed with your visit today?   Anthony Wu has provided verbal consent on 11/18/2019 for a virtual visit (video or telephone).   Kathleen Lime, RN 11/18/2019  1:54 PM

## 2019-11-19 ENCOUNTER — Encounter: Payer: Self-pay | Admitting: Family Medicine

## 2020-04-08 ENCOUNTER — Ambulatory Visit
Admission: EM | Admit: 2020-04-08 | Discharge: 2020-04-08 | Disposition: A | Discharge status: Home / Self Care | Payer: Medicaid Other | Attending: Emergency Medicine | Admitting: Emergency Medicine

## 2020-04-08 ENCOUNTER — Other Ambulatory Visit: Payer: Self-pay

## 2020-04-08 ENCOUNTER — Encounter: Payer: Self-pay | Admitting: Emergency Medicine

## 2020-04-08 DIAGNOSIS — Z1152 Encounter for screening for COVID-19: Secondary | ICD-10-CM

## 2020-04-08 DIAGNOSIS — J069 Acute upper respiratory infection, unspecified: Secondary | ICD-10-CM | POA: Insufficient documentation

## 2020-04-08 LAB — POCT RAPID STREP A (OFFICE): Rapid Strep A Screen: NEGATIVE

## 2020-04-08 MED ORDER — DEXAMETHASONE 4 MG PO TABS: 4.0000 mg | ORAL_TABLET | Freq: Every day | ORAL | 0 refills | Status: AC

## 2020-04-08 MED ORDER — CETIRIZINE HCL 10 MG PO TABS: 10.0000 mg | ORAL_TABLET | Freq: Every day | ORAL | 0 refills | Status: DC

## 2020-04-08 MED ORDER — FLUTICASONE PROPIONATE 50 MCG/ACT NA SUSP: 1.0000 | Freq: Every day | NASAL | 0 refills | Status: DC

## 2020-04-08 MED ORDER — BENZONATATE 100 MG PO CAPS: 100.0000 mg | ORAL_CAPSULE | Freq: Three times a day (TID) | ORAL | 0 refills | Status: DC

## 2020-04-08 NOTE — ED Triage Notes (Signed)
Pt reports cough, fever, and sore throat x 2 days.

## 2020-04-08 NOTE — ED Provider Notes (Signed)
[facility]   [id number] 04/08/20 Arrival Time: 1254   CC: COVID symptoms  SUBJECTIVE: History from: patient.  DONEL OSOWSKI is a 19 y.o. male who presented to the urgent care for complaint of fever, cough and sore throa for the past 2 days.  Denies sick exposure to COVID, flu or strep.  Denies recent travel.  Has tried OTC medication without relief.  Denies aggravating factors.  Denies previous symptoms in the past.   Denies fever, chills, fatigue, sinus pain, rhinorrhea, sore throat, SOB, wheezing, chest pain, nausea, changes in bowel or bladder habits.    ROS: As per HPI.  All other pertinent ROS negative.     Past Medical History:  Diagnosis Date   Seasonal allergies    History reviewed. No pertinent surgical history. No Known Allergies No current facility-administered medications on file prior to encounter.   Current Outpatient Medications on File Prior to Encounter  Medication Sig Dispense Refill   azelastine (ASTELIN) 0.1 % nasal spray Place 2 sprays into both nostrils 2 (two) times daily. 30 mL 11   brompheniramine-pseudoephedrine-DM 30-2-10 MG/5ML syrup Take 10 mLs by mouth 4 (four) times daily as needed (coughing). 300 mL 0   ibuprofen (ADVIL) 800 MG tablet Take 1 tablet (800 mg total) by mouth every 8 (eight) hours as needed. 45 tablet 0   montelukast (SINGULAIR) 10 MG tablet Take 1 tablet (10 mg total) by mouth at bedtime. 30 tablet 5   Social History   Socioeconomic History   Marital status: Single    Spouse name: Not on file   Number of children: Not on file   Years of education: Not on file   Highest education level: Not on file  Occupational History   Not on file  Tobacco Use   Smoking status: Passive Smoke Exposure - Never Smoker   Smokeless tobacco: Never Used  Vaping Use   Vaping Use: Never used  Substance and Sexual Activity   Alcohol use: No   Drug use: No   Sexual activity: Not on file  Other Topics Concern   Not  on file  Social History Narrative   Not on file   Social Determinants of Health   Financial Resource Strain:    Difficulty of Paying Living Expenses: Not on file  Food Insecurity:    Worried About Running Out of Food in the Last Year: Not on file   Ran Out of Food in the Last Year: Not on file  Transportation Needs:    Lack of Transportation (Medical): Not on file   Lack of Transportation (Non-Medical): Not on file  Physical Activity:    Days of Exercise per Week: Not on file   Minutes of Exercise per Session: Not on file  Stress:    Feeling of Stress : Not on file  Social Connections:    Frequency of Communication with Friends and Family: Not on file   Frequency of Social Gatherings with Friends and Family: Not on file   Attends Religious Services: Not on file   Active Member of Clubs or Organizations: Not on file   Attends Banker Meetings: Not on file   Marital Status: Not on file  Intimate Partner Violence:    Fear of Current or Ex-Partner: Not on file   Emotionally Abused: Not on file   Physically Abused: Not on file   Sexually Abused: Not on file   Family History  Problem Relation Age of Onset   Allergic rhinitis Father  COPD Father    Angioedema Neg Hx    Asthma Neg Hx    Atopy Neg Hx    Eczema Neg Hx    Immunodeficiency Neg Hx    Urticaria Neg Hx     OBJECTIVE:  Vitals:   04/08/20 1337  BP: 121/84  Pulse: (!) 112  Resp: 18  Temp: (!) 100.8 F (38.2 C)  TempSrc: Oral  SpO2: 97%     General appearance: alert; appears fatigued, but nontoxic; speaking in full sentences and tolerating own secretions HEENT: NCAT; Ears: EACs clear, TMs pearly gray; Eyes: PERRL.  EOM grossly intact. Sinuses: nontender; Nose: nares patent without rhinorrhea, Throat: oropharynx clear, tonsils non erythematous or enlarged, uvula midline  Neck: supple without LAD Lungs: unlabored respirations, symmetrical air entry; cough: moderate; no  respiratory distress; CTAB Heart: regular rate and rhythm.  Radial pulses 2+ symmetrical bilaterally Skin: warm and dry Psychological: alert and cooperative; normal mood and affect  LABS:  No results found for this or any previous visit (from the past 24 hour(s)).   ASSESSMENT & PLAN:  1. URI with cough and congestion   2. Encounter for screening for COVID-19     Meds ordered this encounter  Medications   cetirizine (ZYRTEC ALLERGY) 10 MG tablet    Sig: Take 1 tablet (10 mg total) by mouth daily.    Dispense:  30 tablet    Refill:  0   fluticasone (FLONASE) 50 MCG/ACT nasal spray    Sig: Place 1 spray into both nostrils daily for 14 days.    Dispense:  16 g    Refill:  0   dexamethasone (DECADRON) 4 MG tablet    Sig: Take 1 tablet (4 mg total) by mouth daily for 7 days.    Dispense:  7 tablet    Refill:  0   benzonatate (TESSALON) 100 MG capsule    Sig: Take 1 capsule (100 mg total) by mouth every 8 (eight) hours.    Dispense:  30 capsule    Refill:  0    Discharge Instructions    COVID testing ordered.  It will take between 2-7 days for test results.  Someone will contact you regarding abnormal results.    In the meantime: You should remain isolated in your home for 10 days from symptom onset AND greater than 24 hours after symptoms resolution (absence of fever without the use of fever-reducing medication and improvement in respiratory symptoms), whichever is longer Get plenty of rest and push fluids Tessalon Perles prescribed for cough Zyrtec for nasal congestion, runny nose, and/or sore throat Flonase for nasal congestion and runny nose Decadron prescribed Use medications daily for symptom relief Use OTC medications like ibuprofen or tylenol as needed fever or pain Call or go to the ED if you have any new or worsening symptoms such as fever, worsening cough, shortness of breath, chest tightness, chest pain, turning blue, changes in mental status, etc...    Reviewed expectations re: course of current medical issues. Questions answered. Outlined signs and symptoms indicating need for more acute intervention. Patient verbalized understanding. After Visit Summary given.         Durward Parcel, FNP 04/08/20 1348

## 2020-04-08 NOTE — Discharge Instructions (Addendum)
COVID testing ordered.  It will take between 2-7 days for test results.  Someone will contact you regarding abnormal results.    In the meantime: You should remain isolated in your home for 10 days from symptom onset AND greater than 24 hours after symptoms resolution (absence of fever without the use of fever-reducing medication and improvement in respiratory symptoms), whichever is longer Get plenty of rest and push fluids Tessalon Perles prescribed for cough Zyrtec for nasal congestion, runny nose, and/or sore throat Flonase for nasal congestion and runny nose Decadron prescribed Use medications daily for symptom relief Use OTC medications like ibuprofen or tylenol as needed fever or pain Call or go to the ED if you have any new or worsening symptoms such as fever, worsening cough, shortness of breath, chest tightness, chest pain, turning blue, changes in mental status, etc..Marland Kitchen

## 2020-04-09 LAB — NOVEL CORONAVIRUS, NAA: SARS-CoV-2, NAA: NOT DETECTED

## 2020-04-09 LAB — SARS-COV-2, NAA 2 DAY TAT

## 2020-04-10 LAB — CULTURE, GROUP A STREP (THRC)

## 2020-04-21 ENCOUNTER — Other Ambulatory Visit: Payer: Self-pay

## 2020-04-21 ENCOUNTER — Ambulatory Visit
Admission: EM | Admit: 2020-04-21 | Discharge: 2020-04-21 | Disposition: A | Discharge status: Home / Self Care | Payer: Medicaid Other | Attending: Emergency Medicine | Admitting: Emergency Medicine

## 2020-04-21 ENCOUNTER — Encounter: Payer: Self-pay | Admitting: Emergency Medicine

## 2020-04-21 DIAGNOSIS — J069 Acute upper respiratory infection, unspecified: Secondary | ICD-10-CM

## 2020-04-21 DIAGNOSIS — R6889 Other general symptoms and signs: Secondary | ICD-10-CM | POA: Diagnosis not present

## 2020-04-21 LAB — POCT RAPID STREP A (OFFICE): Rapid Strep A Screen: NEGATIVE

## 2020-04-21 MED ORDER — BENZONATATE 200 MG PO CAPS: 200.0000 mg | ORAL_CAPSULE | Freq: Three times a day (TID) | ORAL | 0 refills | Status: AC | PRN

## 2020-04-21 MED ORDER — CETIRIZINE HCL 10 MG PO CAPS: 10.0000 mg | ORAL_CAPSULE | Freq: Every day | ORAL | 0 refills | Status: DC

## 2020-04-21 MED ORDER — DM-GUAIFENESIN ER 30-600 MG PO TB12: 1.0000 | ORAL_TABLET | Freq: Two times a day (BID) | ORAL | 0 refills | Status: DC

## 2020-04-21 NOTE — Discharge Instructions (Signed)
Strep test negative, Covid test pending Tessalon for cough Begin daily cetirizine and Mucinex DM twice daily to help with congestion and drainage Ibuprofen and Tylenol as needed for sore throat Rest and fluids Follow-up if not improving or worsening

## 2020-04-21 NOTE — ED Provider Notes (Signed)
[facility]    CSN: [id number] Arrival date & time: 04/21/20  1446      History   Chief Complaint Chief Complaint  Patient presents with  . Neck Pain    HPI Anthony Wu is a 19 y.o. male presenting today for evaluation of body aches sore throat and cough.  Reports symptoms began approximately 2 days ago.  Today he has developed increased aches in his legs and back.  Has noticed some swollen lymph nodes to his neck and has discomfort with swallowing.  Denies any known fevers.  Denies close sick contacts.  HPI  Past Medical History:  Diagnosis Date  . Seasonal allergies     Patient Active Problem List   Diagnosis Date Noted  . Allergic rhinitis 10/24/2012    History reviewed. No pertinent surgical history.     Home Medications    Prior to Admission medications   Medication Sig Start Date End Date Taking? Authorizing Provider  azelastine (ASTELIN) 0.1 % nasal spray Place 2 sprays into both nostrils 2 (two) times daily. 05/08/19   Alfonse Spruce, MD  benzonatate (TESSALON) 200 MG capsule Take 1 capsule (200 mg total) by mouth 3 (three) times daily as needed for up to 7 days for cough. 04/21/20 04/28/20  Hager Compston C, PA-C  Cetirizine HCl 10 MG CAPS Take 1 capsule (10 mg total) by mouth daily for 10 days. 04/21/20 05/01/20  Jaqualyn Juday C, PA-C  dextromethorphan-guaiFENesin (MUCINEX DM) 30-600 MG 12hr tablet Take 1 tablet by mouth 2 (two) times daily. 04/21/20   Salene Mohamud C, PA-C  fluticasone (FLONASE) 50 MCG/ACT nasal spray Place 1 spray into both nostrils daily for 14 days. 04/08/20 04/22/20  Avegno, Zachery Dakins, FNP  ibuprofen (ADVIL) 800 MG tablet Take 1 tablet (800 mg total) by mouth every 8 (eight) hours as needed. 11/18/19   Ladona Ridgel, Malena M, DO  montelukast (SINGULAIR) 10 MG tablet Take 1 tablet (10 mg total) by mouth at bedtime. 05/08/19 06/07/19  Alfonse Spruce, MD    Family History Family History  Problem Relation Age  of Onset  . Allergic rhinitis Father   . COPD Father   . Angioedema Neg Hx   . Asthma Neg Hx   . Atopy Neg Hx   . Eczema Neg Hx   . Immunodeficiency Neg Hx   . Urticaria Neg Hx     Social History Social History   Tobacco Use  . Smoking status: Passive Smoke Exposure - Never Smoker  . Smokeless tobacco: Never Used  Vaping Use  . Vaping Use: Never used  Substance Use Topics  . Alcohol use: No  . Drug use: No     Allergies   Patient has no known allergies.   Review of Systems Review of Systems  Constitutional: Negative for activity change, appetite change, chills, fatigue and fever.  HENT: Positive for congestion, rhinorrhea and sore throat. Negative for ear pain, sinus pressure and trouble swallowing.   Eyes: Negative for discharge and redness.  Respiratory: Positive for cough. Negative for chest tightness and shortness of breath.   Cardiovascular: Negative for chest pain.  Gastrointestinal: Negative for abdominal pain, diarrhea, nausea and vomiting.  Musculoskeletal: Positive for myalgias.  Skin: Negative for rash.  Neurological: Negative for dizziness, light-headedness and headaches.     Physical Exam Triage Vital Signs ED Triage Vitals  Enc Vitals Group     BP      Pulse      Resp  Temp      Temp src      SpO2      Weight      Height      Head Circumference      Peak Flow      Pain Score      Pain Loc      Pain Edu?      Excl. in GC?    No data found.  Updated Vital Signs BP 125/83 (BP Location: Right Arm)   Pulse 86   Temp 99.2 F (37.3 C) (Oral)   Resp 19   Ht 5\' 7"  (1.702 m)   Wt 240 lb (108.9 kg)   SpO2 97%   BMI 37.59 kg/m   Visual Acuity Right Eye Distance:   Left Eye Distance:   Bilateral Distance:    Right Eye Near:   Left Eye Near:    Bilateral Near:     Physical Exam Vitals and nursing note reviewed.  Constitutional:      Appearance: He is well-developed.     Comments: No acute distress  HENT:     Head:  Normocephalic and atraumatic.     Ears:     Comments: Bilateral ears without tenderness to palpation of external auricle, tragus and mastoid, EAC's without erythema or swelling, TM's with good bony landmarks and cone of light. Non erythematous.     Nose: Nose normal.     Mouth/Throat:     Comments: Oral mucosa pink and moist, no tonsillar enlargement or exudate. Posterior pharynx patent and nonerythematous, no uvula deviation or swelling. Normal phonation. Eyes:     Conjunctiva/sclera: Conjunctivae normal.  Cardiovascular:     Rate and Rhythm: Normal rate.  Pulmonary:     Effort: Pulmonary effort is normal. No respiratory distress.     Comments: Breathing comfortably at rest, CTABL, no wheezing, rales or other adventitious sounds auscultated Abdominal:     General: There is no distension.  Musculoskeletal:        General: Normal range of motion.     Cervical back: Neck supple.  Skin:    General: Skin is warm and dry.  Neurological:     Mental Status: He is alert and oriented to person, place, and time.      UC Treatments / Results  Labs (all labs ordered are listed, but only abnormal results are displayed) Labs Reviewed  CULTURE, GROUP A STREP ([facility])  NOVEL CORONAVIRUS, NAA  POCT RAPID STREP A (OFFICE)    EKG   Radiology No results found.  Procedures Procedures (including critical care time)  Medications Ordered in UC Medications - No data to display  Initial Impression / Assessment and Plan / UC Course  I have reviewed the triage vital signs and the nursing notes.  Pertinent labs & imaging results that were available during my care of the patient were reviewed by me and considered in my medical decision making (see chart for details).     Strep test negative, Covid pending.  Suspect likely viral URI, exam reassuring.  Recommending symptomatic and supportive care rest and fluids.  Monitor for gradual resolution.  Discussed strict return precautions. Patient  verbalized understanding and is agreeable with plan.  Final Clinical Impressions(s) / UC Diagnoses   Final diagnoses:  Viral URI with cough     Discharge Instructions     Strep test negative, Covid test pending Tessalon for cough Begin daily cetirizine and Mucinex DM twice daily to help with congestion and drainage Ibuprofen  and Tylenol as needed for sore throat Rest and fluids Follow-up if not improving or worsening    ED Prescriptions    Medication Sig Dispense Auth. Provider   benzonatate (TESSALON) 200 MG capsule Take 1 capsule (200 mg total) by mouth 3 (three) times daily as needed for up to 7 days for cough. 28 capsule Tuvia Woodrick C, PA-C   Cetirizine HCl 10 MG CAPS Take 1 capsule (10 mg total) by mouth daily for 10 days. 10 capsule Alejah Aristizabal C, PA-C   dextromethorphan-guaiFENesin (MUCINEX DM) 30-600 MG 12hr tablet Take 1 tablet by mouth 2 (two) times daily. 20 tablet Harlym Gehling, Dell C, PA-C     PDMP not reviewed this encounter.   Lew Dawes, PA-C 04/21/20 1528

## 2020-04-21 NOTE — ED Triage Notes (Addendum)
Starting Sunday pt was having body aches, sore throat, swollen lymph node to LT side of neck and cough.

## 2020-04-22 LAB — NOVEL CORONAVIRUS, NAA: SARS-CoV-2, NAA: NOT DETECTED

## 2020-04-22 LAB — SARS-COV-2, NAA 2 DAY TAT

## 2020-04-22 IMAGING — CR DG CHEST 1V PORT
1 series · 1 of 1 positions shown · non-contrast
Comparison: Chest radiograph 04/27/2013

CLINICAL DATA: Reason for exam: cough, fever To ED for eval of
fever after having an abscess drained (coccyx) yesterday. Today woke
with high fever. Told to come to ED for further eval. Pt. States he
developed a cough yesterday

EXAM:
PORTABLE CHEST 1 VIEW

[AP]
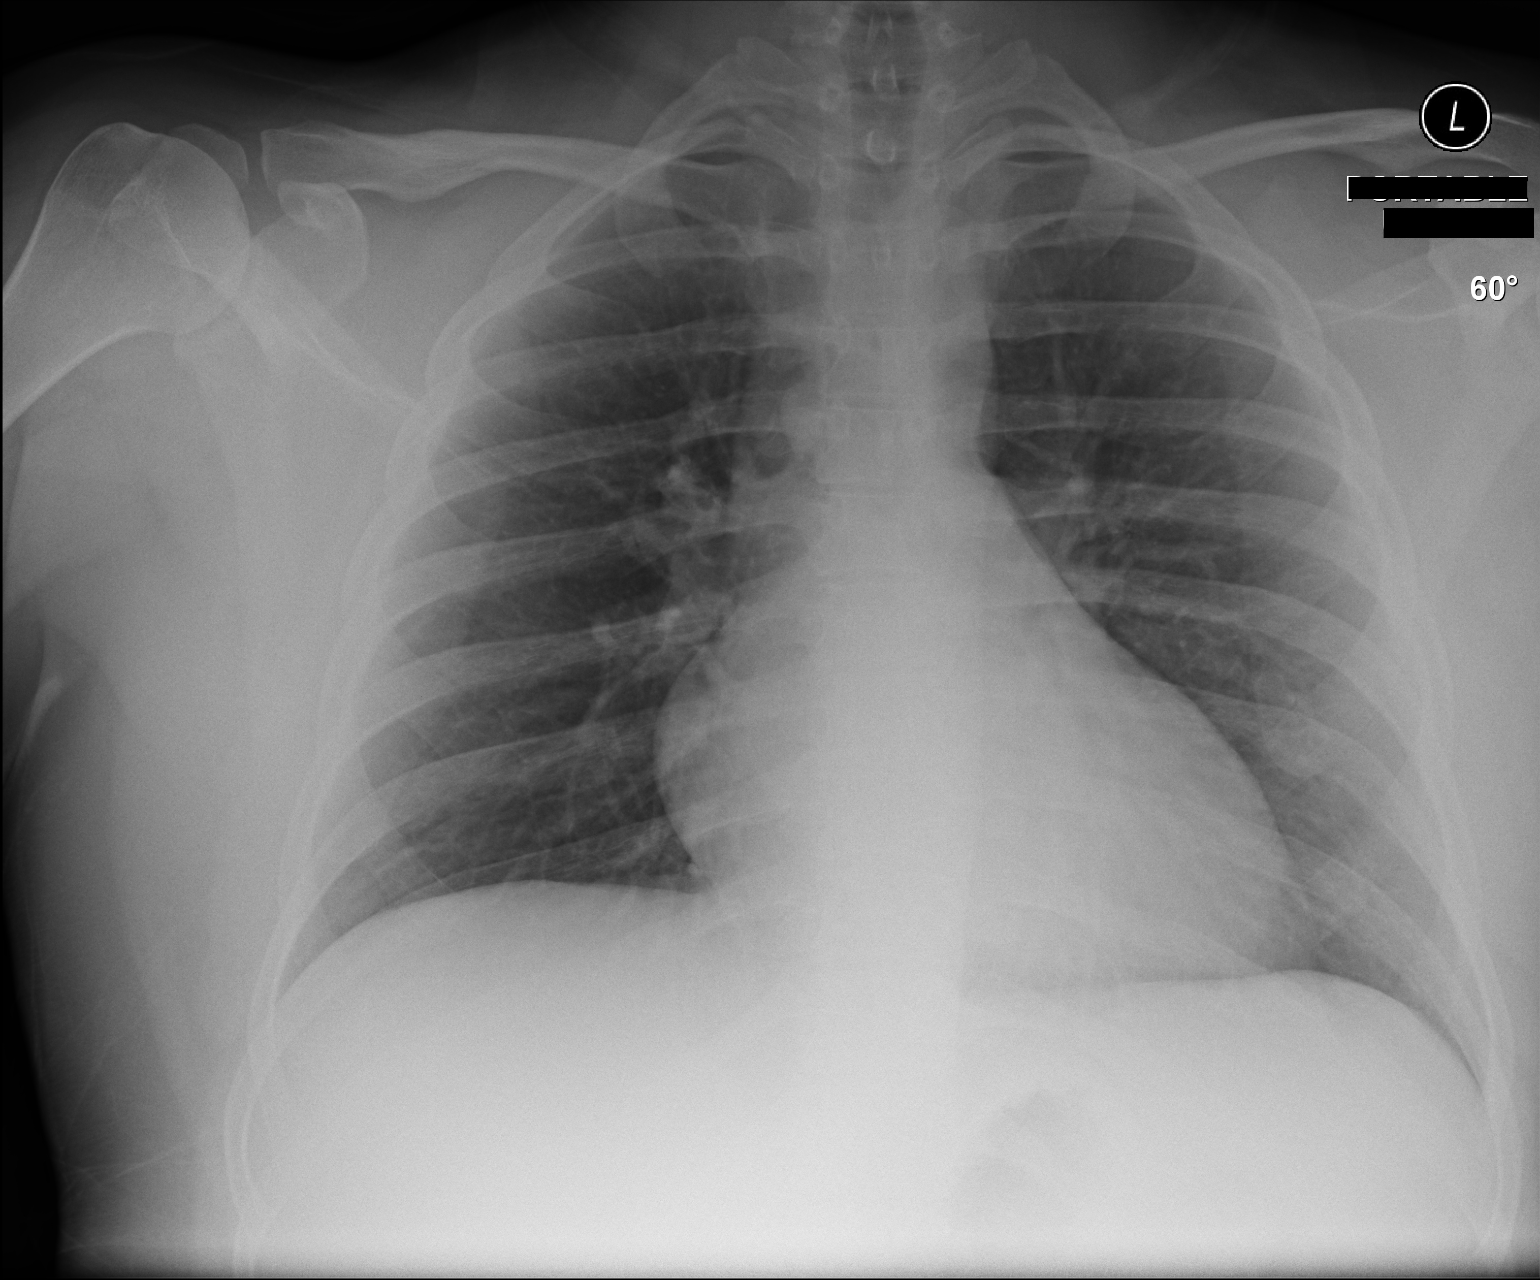

[1 of 1 positions shown; findings below may reference images not displayed]

FINDINGS: Heart size and mediastinal contours within normal limits for AP
technique. The lungs are clear. No pneumothorax or pleural effusion.
The visualized skeletal structures are unremarkable.
IMPRESSION: No acute cardiopulmonary process.

## 2020-04-24 LAB — CULTURE, GROUP A STREP (THRC)

## 2020-10-12 ENCOUNTER — Encounter: Payer: Self-pay | Admitting: Emergency Medicine

## 2020-10-12 ENCOUNTER — Other Ambulatory Visit: Payer: Self-pay

## 2020-10-12 ENCOUNTER — Ambulatory Visit
Admission: EM | Admit: 2020-10-12 | Discharge: 2020-10-12 | Disposition: A | Discharge status: Home / Self Care | Payer: Medicaid Other | Attending: Internal Medicine | Admitting: Internal Medicine

## 2020-10-12 DIAGNOSIS — I889 Nonspecific lymphadenitis, unspecified: Secondary | ICD-10-CM | POA: Diagnosis not present

## 2020-10-12 MED ORDER — CEPHALEXIN 500 MG PO CAPS: 500.0000 mg | ORAL_CAPSULE | Freq: Three times a day (TID) | ORAL | 0 refills | Status: AC

## 2020-10-12 NOTE — ED Triage Notes (Signed)
Swollen lymph node left side of neck since Saturday.  Denies fever.

## 2020-10-12 NOTE — ED Provider Notes (Signed)
[facility]    CSN: [id number] Arrival date & time: 10/12/20  1518      History   Chief Complaint No chief complaint on file.   HPI Anthony Wu is a 20 y.o. male who notied swollen nodes on his L neck x 2 days. Does not feel is larger, denies ST, ear pain, URI  or fatigue or fever    Past Medical History:  Diagnosis Date  . Seasonal allergies     Patient Active Problem List   Diagnosis Date Noted  . Allergic rhinitis 10/24/2012    History reviewed. No pertinent surgical history.     Home Medications    Prior to Admission medications   Medication Sig Start Date End Date Taking? Authorizing Provider  azelastine (ASTELIN) 0.1 % nasal spray Place 2 sprays into both nostrils 2 (two) times daily. 05/08/19   Alfonse Spruce, MD  Cetirizine HCl 10 MG CAPS Take 1 capsule (10 mg total) by mouth daily for 10 days. 04/21/20 05/01/20  Wieters, Hallie C, PA-C  dextromethorphan-guaiFENesin (MUCINEX DM) 30-600 MG 12hr tablet Take 1 tablet by mouth 2 (two) times daily. 04/21/20   Wieters, Hallie C, PA-C  fluticasone (FLONASE) 50 MCG/ACT nasal spray Place 1 spray into both nostrils daily for 14 days. 04/08/20 04/22/20  Avegno, Zachery Dakins, FNP  ibuprofen (ADVIL) 800 MG tablet Take 1 tablet (800 mg total) by mouth every 8 (eight) hours as needed. 11/18/19   Ladona Ridgel, Malena M, DO  montelukast (SINGULAIR) 10 MG tablet Take 1 tablet (10 mg total) by mouth at bedtime. 05/08/19 06/07/19  Alfonse Spruce, MD    Family History Family History  Problem Relation Age of Onset  . Allergic rhinitis Father   . COPD Father   . Angioedema Neg Hx   . Asthma Neg Hx   . Atopy Neg Hx   . Eczema Neg Hx   . Immunodeficiency Neg Hx   . Urticaria Neg Hx     Social History Social History   Tobacco Use  . Smoking status: Passive Smoke Exposure - Never Smoker  . Smokeless tobacco: Never Used  Vaping Use  . Vaping Use: Never used  Substance Use Topics  . Alcohol use: No   . Drug use: No     Allergies   Patient has no known allergies.   Review of Systems Review of Systems  Constitutional: Negative for activity change, appetite change, chills, diaphoresis, fatigue and fever.  HENT: Negative for congestion, ear discharge, ear pain, mouth sores, postnasal drip, rhinorrhea and trouble swallowing.   Eyes: Negative for discharge.  Respiratory: Negative for cough.   Musculoskeletal: Negative for myalgias.  Skin: Negative for rash and wound.  Allergic/Immunologic: Negative for environmental allergies.  Neurological: Negative for headaches.  Hematological: Negative for adenopathy.   + for tender and enlarged L neck gland, the rest is neg  Physical Exam Triage Vital Signs ED Triage Vitals  Enc Vitals Group     BP 10/12/20 1615 131/80     Pulse Rate 10/12/20 1615 66     Resp 10/12/20 1615 18     Temp 10/12/20 1615 97.6 F (36.4 C)     Temp Source 10/12/20 1615 Oral     SpO2 10/12/20 1615 96 %     Weight --      Height --      Head Circumference --      Peak Flow --      Pain Score 10/12/20 1616 3  Pain Loc --      Pain Edu? --      Excl. in GC? --    No data found.  Updated Vital Signs BP 131/80 (BP Location: Right Arm)   Pulse 66   Temp 97.6 F (36.4 C) (Oral)   Resp 18   SpO2 96%   Visual Acuity Right Eye Distance:   Left Eye Distance:   Bilateral Distance:    Right Eye Near:   Left Eye Near:    Bilateral Near:     Physical Exam Vitals and nursing note reviewed.  Constitutional:      General: He is not in acute distress.    Appearance: Normal appearance. He is normal weight. He is not toxic-appearing.  HENT:     Head: Normocephalic.     Comments: No rashes or wounds behind L ear, neck or scalp    Right Ear: Tympanic membrane, ear canal and external ear normal.     Nose: Nose normal.     Mouth/Throat:     Pharynx: Oropharynx is clear.  Eyes:     General: No scleral icterus.    Conjunctiva/sclera: Conjunctivae  normal.  Cardiovascular:     Rate and Rhythm: Normal rate and regular rhythm.  Pulmonary:     Effort: Pulmonary effort is normal.     Breath sounds: Normal breath sounds.  Musculoskeletal:        General: Normal range of motion.     Cervical back: Neck supple.  Lymphadenopathy:     Comments: 2x3 soft movable node on R upper chain   Skin:    General: Skin is warm and dry.  Neurological:     Mental Status: He is alert and oriented to person, place, and time.     Gait: Gait normal.  Psychiatric:        Mood and Affect: Mood normal.        Behavior: Behavior normal.        Thought Content: Thought content normal.        Judgment: Judgment normal.      UC Treatments / Results  Labs (all labs ordered are listed, but only abnormal results are displayed) Labs Reviewed - No data to display  EKG   Radiology No results found.  Procedures Procedures (including critical care time)  Medications Ordered in UC Medications - No data to display  Initial Impression / Assessment and Plan / UC Course  I have reviewed the triage vital signs and the nursing notes. Has L neck lymphadenitis of unknown cause. I placed him on Keflex as noted and see if this will help it resolve. If not needs to FU with PCP Final Clinical Impressions(s) / UC Diagnoses   Final diagnoses:  None   Discharge Instructions   None    ED Prescriptions    None     PDMP not reviewed this encounter.   Garey Ham, [place] 10/14/20 1940

## 2020-10-12 NOTE — Discharge Instructions (Signed)
Follow up with your primary care if the node does not resolve or gets larger after completing the antibiotic

## 2020-12-20 ENCOUNTER — Other Ambulatory Visit: Payer: Self-pay

## 2020-12-20 ENCOUNTER — Ambulatory Visit
Admission: EM | Admit: 2020-12-20 | Discharge: 2020-12-20 | Disposition: A | Discharge status: Home / Self Care | Payer: Medicaid Other | Attending: Emergency Medicine | Admitting: Emergency Medicine

## 2020-12-20 DIAGNOSIS — L237 Allergic contact dermatitis due to plants, except food: Secondary | ICD-10-CM | POA: Diagnosis not present

## 2020-12-20 MED ORDER — HYDROXYZINE HCL 25 MG PO TABS: 25.0000 mg | ORAL_TABLET | Freq: Four times a day (QID) | ORAL | 0 refills | Status: DC | PRN

## 2020-12-20 MED ORDER — MUPIROCIN 2 % EX OINT: 1.0000 "application " | TOPICAL_OINTMENT | Freq: Two times a day (BID) | CUTANEOUS | 0 refills | Status: DC

## 2020-12-20 MED ORDER — PREDNISONE 10 MG PO TABS: ORAL_TABLET | ORAL | 0 refills | Status: DC

## 2020-12-20 MED ORDER — BETAMETHASONE DIPROPIONATE 0.05 % EX OINT: TOPICAL_OINTMENT | Freq: Two times a day (BID) | CUTANEOUS | 0 refills | Status: DC

## 2020-12-20 NOTE — ED Triage Notes (Signed)
Patient presents to Urgent Care with complaints of possible poison ivy rash on bilateral arms from working outdoor last Sunday. Pt states rash has spread and getting worse. Treating with calamine lotion, benadryl with some relief.   Denies fever.

## 2020-12-20 NOTE — ED Provider Notes (Signed)
HPI  SUBJECTIVE:  Anthony Wu is a 20 y.o. male who presents with 6 days of an intensely pruritic, erythematous vesicular, papular rash starting the day after working outside where he may have been exposed to poison ivy, poison oak or poison sumac.  He is not sure which one.Marland Kitchen  He states it is spreading.  It is on his bilateral forearms, left AC fossa, hands.  He denies crusting, fevers, pain.  He has tried calamine, Benadryl and Ivarest with improvement in symptoms.  No aggravating factors.  Past medical history negative for diabetes.  LMP: [facility].    Past Medical History:  Diagnosis Date   Seasonal allergies     History reviewed. No pertinent surgical history.  Family History  Problem Relation Age of Onset   Allergic rhinitis Father    COPD Father    Angioedema Neg Hx    Asthma Neg Hx    Atopy Neg Hx    Eczema Neg Hx    Immunodeficiency Neg Hx    Urticaria Neg Hx     Social History   Tobacco Use   Smoking status: Passive Smoke Exposure - Never Smoker   Smokeless tobacco: Never  Vaping Use   Vaping Use: Never used  Substance Use Topics   Alcohol use: No   Drug use: No    No current facility-administered medications for this encounter.  Current Outpatient Medications:    betamethasone dipropionate (DIPROLENE) 0.05 % ointment, Apply topically 2 (two) times daily., Disp: 30 g, Rfl: 0   hydrOXYzine (ATARAX/VISTARIL) 25 MG tablet, Take 1 tablet (25 mg total) by mouth every 6 (six) hours as needed for itching., Disp: 20 tablet, Rfl: 0   mupirocin ointment (BACTROBAN) 2 %, Apply 1 application topically 2 (two) times daily., Disp: 22 g, Rfl: 0   predniSONE (DELTASONE) 10 MG tablet, 6 tabs on day 1-2, 5 tabs on day 3-4, 4 tabs on day 5-6, 3 tabs on day 7-8, 2 tabs day 9-10, 1 tab day 11-12, Disp: 42 tablet, Rfl: 0   azelastine (ASTELIN) 0.1 % nasal spray, Place 2 sprays into both nostrils 2 (two) times daily., Disp: 30 mL, Rfl: 11   Cetirizine HCl 10 MG  CAPS, Take 1 capsule (10 mg total) by mouth daily for 10 days., Disp: 10 capsule, Rfl: 0  No Known Allergies   ROS  As noted in HPI.   Physical Exam  BP (!) 143/98 (BP Location: Right Arm)   Pulse (!) 117   Temp 98.3 F (36.8 C) (Oral)   Resp 16   SpO2 97%   Constitutional: Well developed, well nourished, no acute distress Eyes:  EOMI, conjunctiva normal bilaterally HENT: Normocephalic, atraumatic,mucus membranes moist Respiratory: Normal inspiratory effort Cardiovascular: Normal rate GI: nondistended skin: Nontender erythematous papular, linear rash over bilateral forearms.  Positive scabbing.          Musculoskeletal: no deformities Neurologic: Alert & oriented x 3, no focal neuro deficits Psychiatric: Speech and behavior appropriate   ED Course   Medications - No data to display  No orders of the defined types were placed in this encounter.   No results found for this or any previous visit (from the past 24 hour(s)). No results found.  ED Clinical Impression  1. Poison ivy dermatitis      ED Assessment/Plan  Presentation consistent with a poison ivy dermatitis.  Will send home with Claritin or Zyrtec, if that does not work, then Atarax.  will try a medium  potency steroid cream versus ointment, and Bactroban.  Follow-up with PMD as needed.    Meds ordered this encounter  Medications   betamethasone dipropionate (DIPROLENE) 0.05 % ointment    Sig: Apply topically 2 (two) times daily.    Dispense:  30 g    Refill:  0   predniSONE (DELTASONE) 10 MG tablet    Sig: 6 tabs on day 1-2, 5 tabs on day 3-4, 4 tabs on day 5-6, 3 tabs on day 7-8, 2 tabs day 9-10, 1 tab day 11-12    Dispense:  42 tablet    Refill:  0   mupirocin ointment (BACTROBAN) 2 %    Sig: Apply 1 application topically 2 (two) times daily.    Dispense:  22 g    Refill:  0   hydrOXYzine (ATARAX/VISTARIL) 25 MG tablet    Sig: Take 1 tablet (25 mg total) by mouth every 6 (six) hours  as needed for itching.    Dispense:  20 tablet    Refill:  0      *This clinic note was created using Scientist, clinical (histocompatibility and immunogenetics). Therefore, there may be occasional mistakes despite careful proofreading.  ?    Domenick Gong, MD 12/21/20 [phone]

## 2020-12-20 NOTE — Discharge Instructions (Addendum)
Use TecNu before going out in areas with known poison ivy/oak.  This will help prevent you from getting poison ivy/oak.  If you get a rash, you can use Zanfel or TecNu extreme to deactivate the oil, which will stop the rash from spreading and help with the itching.  Apply antibacterial ointment on scabbed areas to help prevent infection.  If you were given steroids, make sure you finish all of them.  Try the steroid ointment first, if this does not work, then you can use the oral steroids.  You may take Claritin, or Zyrtec.  If this does not work, then you can switch to Atarax.  Dissolve 1 packet (or tablet) of Domeboro (aluminum acetate) in 1 pint of luke-warm water. Soak the affected areas with luke-warm Domeboro solution for 5-10 minutes twice daily. You may use apply gauze soaked in the domeboro.  Gently pat dry, Then apply the steriod / antibiotic cream. You may also take oatmeal baths with Aveeno oatmeal (1 cup in half full bathtub) or cornstarch/baking soda (1 cup each in half full bathtub). To prevent the oatmeal from caking in pipes, place it in a tied sock before dropping it into the bathtub.  Go to www.goodrx.com to look up your medications. This will give you a list of where you can find your prescriptions at the most affordable prices. Or ask the pharmacist what the cash price is, or if they have any other discount programs available to help make your medication more affordable. This can be less expensive than what you would pay with insurance.

## 2022-01-19 ENCOUNTER — Ambulatory Visit
Admission: EM | Admit: 2022-01-19 | Discharge: 2022-01-19 | Disposition: A | Discharge status: Home / Self Care | Payer: Medicaid Other | Attending: Nurse Practitioner | Admitting: Nurse Practitioner

## 2022-01-19 ENCOUNTER — Encounter: Payer: Self-pay | Admitting: Emergency Medicine

## 2022-01-19 ENCOUNTER — Other Ambulatory Visit: Payer: Self-pay

## 2022-01-19 DIAGNOSIS — J029 Acute pharyngitis, unspecified: Secondary | ICD-10-CM | POA: Diagnosis not present

## 2022-01-19 LAB — POCT RAPID STREP A (OFFICE): Rapid Strep A Screen: NEGATIVE

## 2022-01-19 NOTE — Discharge Instructions (Addendum)
Rapid strep test is negative, COVID/flu and throat culture are pending. You will be contacted if the results are positive. Increase fluids and allow for plenty of rest. Recommend Ibuprofen every 8 hours as needed for pain, fever, or general discomfort. This will help with inflammation. Take medication with food and water.  Recommend throat lozenges, Chloraseptic or honey to help with throat pain. Warm salt water gargles 3-4 times daily to help with throat pain or discomfort. Recommend a diet with soft foods to include soups, broths, puddings, yogurt, Jell-O's, or popsicles until symptoms improve. Follow-up if symptoms do not improve.

## 2022-01-19 NOTE — ED Provider Notes (Signed)
[facility]    CSN: [id number] Arrival date & time: 01/19/22  1117      History   Chief Complaint Chief Complaint  Patient presents with   Sore Throat    HPI Anthony Wu is a 21 y.o. male.   The history is provided by the patient.   Patient presents for complaints of sore throat that is been present for the past 2 days.  He denies fever, chills, headache, ear pain, nasal congestion, runny nose, cough, or GI symptoms.  States he has been taking over-the-counter Advil cold and sinus for his symptoms.  States that medication does appear to help his throat pain.  He denies any known sick contacts.  Past Medical History:  Diagnosis Date   Seasonal allergies     Patient Active Problem List   Diagnosis Date Noted   Allergic rhinitis 10/24/2012    History reviewed. No pertinent surgical history.     Home Medications    Prior to Admission medications   Medication Sig Start Date End Date Taking? Authorizing Provider  azelastine (ASTELIN) 0.1 % nasal spray Place 2 sprays into both nostrils 2 (two) times daily. 05/08/19   Alfonse Spruce, MD  betamethasone dipropionate (DIPROLENE) 0.05 % ointment Apply topically 2 (two) times daily. 12/20/20   Domenick Gong, MD  Cetirizine HCl 10 MG CAPS Take 1 capsule (10 mg total) by mouth daily for 10 days. 04/21/20 05/01/20  Wieters, Hallie C, PA-C  hydrOXYzine (ATARAX/VISTARIL) 25 MG tablet Take 1 tablet (25 mg total) by mouth every 6 (six) hours as needed for itching. 12/20/20   Domenick Gong, MD  mupirocin ointment (BACTROBAN) 2 % Apply 1 application topically 2 (two) times daily. 12/20/20   Domenick Gong, MD  predniSONE (DELTASONE) 10 MG tablet 6 tabs on day 1-2, 5 tabs on day 3-4, 4 tabs on day 5-6, 3 tabs on day 7-8, 2 tabs day 9-10, 1 tab day 11-12 12/20/20   Domenick Gong, MD  fluticasone [facility]) 50 MCG/ACT nasal spray Place 1 spray into both nostrils daily for 14 days. 04/08/20 10/12/20  Avegno,  Zachery Dakins, FNP  montelukast (SINGULAIR) 10 MG tablet Take 1 tablet (10 mg total) by mouth at bedtime. 05/08/19 10/12/20  Alfonse Spruce, MD    Family History Family History  Problem Relation Age of Onset   Allergic rhinitis Father    COPD Father    Angioedema Neg Hx    Asthma Neg Hx    Atopy Neg Hx    Eczema Neg Hx    Immunodeficiency Neg Hx    Urticaria Neg Hx     Social History Social History   Tobacco Use   Smoking status: Passive Smoke Exposure - Never Smoker   Smokeless tobacco: Never  Vaping Use   Vaping Use: Never used  Substance Use Topics   Alcohol use: No   Drug use: No     Allergies   Patient has no known allergies.   Review of Systems Review of Systems PER HPI  Physical Exam Triage Vital Signs ED Triage Vitals  Enc Vitals Group     BP 01/19/22 1128 (!) 133/92     Pulse Rate 01/19/22 1128 84     Resp 01/19/22 1128 20     Temp 01/19/22 1128 99 F (37.2 C)     Temp Source 01/19/22 1128 Oral     SpO2 01/19/22 1128 96 %     Weight --      Height --  Head Circumference --      Peak Flow --      Pain Score 01/19/22 1129 4     Pain Loc --      Pain Edu? --      Excl. in [place]? --    No data found.  Updated Vital Signs BP (!) 133/92 (BP Location: Right Arm)   Pulse 84   Temp 99 F (37.2 C) (Oral)   Resp 20   SpO2 96%   Visual Acuity Right Eye Distance:   Left Eye Distance:   Bilateral Distance:    Right Eye Near:   Left Eye Near:    Bilateral Near:     Physical Exam Vitals and nursing note reviewed.  Constitutional:      General: He is not in acute distress.    Appearance: He is well-developed.  HENT:     Head: Normocephalic.     Right Ear: Tympanic membrane and ear canal normal.     Left Ear: Tympanic membrane and ear canal normal.     Nose: No congestion or rhinorrhea.     Mouth/Throat:     Mouth: Mucous membranes are moist. No oral lesions.     Pharynx: Uvula midline. Pharyngeal swelling and posterior  oropharyngeal erythema present. No oropharyngeal exudate.     Tonsils: No tonsillar exudate. 1+ on the right. 1+ on the left.  Eyes:     Conjunctiva/sclera: Conjunctivae normal.     Pupils: Pupils are equal, round, and reactive to light.  Cardiovascular:     Rate and Rhythm: Normal rate and regular rhythm.     Pulses: Normal pulses.     Heart sounds: Normal heart sounds.  Pulmonary:     Effort: Pulmonary effort is normal.     Breath sounds: Normal breath sounds. No wheezing.  Abdominal:     General: Bowel sounds are normal.     Palpations: Abdomen is soft.     Tenderness: There is no abdominal tenderness.  Musculoskeletal:     Cervical back: Normal range of motion.  Lymphadenopathy:     Cervical: No cervical adenopathy.  Skin:    General: Skin is warm and dry.  Neurological:     General: No focal deficit present.     Mental Status: He is alert and oriented to person, place, and time.  Psychiatric:        Mood and Affect: Mood normal.        Behavior: Behavior normal.      UC Treatments / Results  Labs (all labs ordered are listed, but only abnormal results are displayed) Labs Reviewed  COVID-19, FLU A+B NAA  CULTURE, GROUP A STREP [facility])  POCT RAPID STREP A (OFFICE)    EKG   Radiology No results found.  Procedures Procedures (including critical care time)  Medications Ordered in UC Medications - No data to display  Initial Impression / Assessment and Plan / UC Course  I have reviewed the triage vital signs and the nursing notes.  Pertinent labs & imaging results that were available during my care of the patient were reviewed by me and considered in my medical decision making (see chart for details).  Patient presents for sore throat that has been present for the past 2 days.  On exam, patient has +1 tonsil swelling with erythema, no exudate is present.  He also has no cervical adenopathy.  Rapid strep test is negative.  Throat culture, COVID/flu test are  pending.  Differential diagnoses include  acute pharyngitis versus viral upper respiratory infection versus allergic rhinitis.  Supportive care recommendations were provided to the patient.  Patient was advised that if the outstanding test results are positive, he will be contacted and provided treatment.  Patient advised to follow-up as needed. Final Clinical Impressions(s) / UC Diagnoses   Final diagnoses:  Acute sore throat     Discharge Instructions      Rapid strep test is negative, COVID/flu and throat culture are pending. You will be contacted if the results are positive. Increase fluids and allow for plenty of rest. Recommend Ibuprofen every 8 hours as needed for pain, fever, or general discomfort. This will help with inflammation. Take medication with food and water.  Recommend throat lozenges, Chloraseptic or honey to help with throat pain. Warm salt water gargles 3-4 times daily to help with throat pain or discomfort. Recommend a diet with soft foods to include soups, broths, puddings, yogurt, Jell-O's, or popsicles until symptoms improve. Follow-up if symptoms do not improve.      ED Prescriptions   None    PDMP not reviewed this encounter.   Tish Men, NP 01/19/22 1157

## 2022-01-19 NOTE — ED Triage Notes (Addendum)
Pt reports sore throat since Monday. Pt reports took otc sinus medication with some improvement of symptoms. Denies any other symptoms at this time.

## 2022-01-20 LAB — COVID-19, FLU A+B NAA
Influenza A, NAA: NOT DETECTED
Influenza B, NAA: NOT DETECTED
SARS-CoV-2, NAA: NOT DETECTED

## 2022-01-21 ENCOUNTER — Ambulatory Visit
Admission: EM | Admit: 2022-01-21 | Discharge: 2022-01-21 | Disposition: A | Discharge status: Home / Self Care | Payer: Medicaid Other | Attending: Nurse Practitioner | Admitting: Nurse Practitioner

## 2022-01-21 ENCOUNTER — Encounter: Payer: Self-pay | Admitting: Emergency Medicine

## 2022-01-21 DIAGNOSIS — J029 Acute pharyngitis, unspecified: Secondary | ICD-10-CM | POA: Diagnosis not present

## 2022-01-21 LAB — POCT MONO SCREEN (KUC): Mono, POC: NEGATIVE

## 2022-01-21 LAB — POCT RAPID STREP A (OFFICE): Rapid Strep A Screen: NEGATIVE

## 2022-01-21 MED ORDER — ACETAMINOPHEN 500 MG PO TABS
1000.0000 mg | ORAL_TABLET | Freq: Once | ORAL | Status: AC
  Administered 2022-01-21: 1000 mg via ORAL

## 2022-01-21 MED ORDER — AMOXICILLIN 500 MG PO CAPS: 500.0000 mg | ORAL_CAPSULE | Freq: Two times a day (BID) | ORAL | 0 refills | Status: AC

## 2022-01-21 NOTE — ED Provider Notes (Addendum)
[facility]    CSN: [id number] Arrival date & time: 01/21/22  1026      History   Chief Complaint No chief complaint on file.   HPI Anthony Wu is a 21 y.o. male.   Patient presents with 4 days of sore throat.  Reports he began having a fever yesterday.  He denies wheezing, shortness of breath, chest pain or tightness, nasal congestion.  Reports he feels like he has shards of glass in his throat.  Denies runny nose, postnasal drainage, headache, sinus pressure, ear pain or pressure, eye redness/drainage, abdominal pain, nausea/vomiting, diarrhea.  Reports he is not eating normally because it hurts to swallow.  Denies new rash.  Reports he has been more tired than normal.  Denies known sick contacts.  Has taken over-the-counter medicine without relief.    Past Medical History:  Diagnosis Date   Seasonal allergies     Patient Active Problem List   Diagnosis Date Noted   Allergic rhinitis 10/24/2012    History reviewed. No pertinent surgical history.     Home Medications    Prior to Admission medications   Medication Sig Start Date End Date Taking? Authorizing Provider  amoxicillin (AMOXIL) 500 MG capsule Take 1 capsule (500 mg total) by mouth 2 (two) times daily for 10 days. 01/21/22 01/31/22 Yes Cathlean Marseilles A, NP  azelastine (ASTELIN) 0.1 % nasal spray Place 2 sprays into both nostrils 2 (two) times daily. 05/08/19   Alfonse Spruce, MD  betamethasone dipropionate (DIPROLENE) 0.05 % ointment Apply topically 2 (two) times daily. 12/20/20   Domenick Gong, MD  Cetirizine HCl 10 MG CAPS Take 1 capsule (10 mg total) by mouth daily for 10 days. 04/21/20 05/01/20  Wieters, Hallie C, PA-C  hydrOXYzine (ATARAX/VISTARIL) 25 MG tablet Take 1 tablet (25 mg total) by mouth every 6 (six) hours as needed for itching. 12/20/20   Domenick Gong, MD  mupirocin ointment (BACTROBAN) 2 % Apply 1 application topically 2 (two) times daily. 12/20/20   Domenick Gong, MD  predniSONE (DELTASONE) 10 MG tablet 6 tabs on day 1-2, 5 tabs on day 3-4, 4 tabs on day 5-6, 3 tabs on day 7-8, 2 tabs day 9-10, 1 tab day 11-12 12/20/20   Domenick Gong, MD  fluticasone [facility]) 50 MCG/ACT nasal spray Place 1 spray into both nostrils daily for 14 days. 04/08/20 10/12/20  Avegno, Zachery Dakins, FNP  montelukast (SINGULAIR) 10 MG tablet Take 1 tablet (10 mg total) by mouth at bedtime. 05/08/19 10/12/20  Alfonse Spruce, MD    Family History Family History  Problem Relation Age of Onset   Allergic rhinitis Father    COPD Father    Angioedema Neg Hx    Asthma Neg Hx    Atopy Neg Hx    Eczema Neg Hx    Immunodeficiency Neg Hx    Urticaria Neg Hx     Social History Social History   Tobacco Use   Smoking status: Passive Smoke Exposure - Never Smoker   Smokeless tobacco: Never  Vaping Use   Vaping Use: Never used  Substance Use Topics   Alcohol use: No   Drug use: No     Allergies   Patient has no known allergies.   Review of Systems Review of Systems Per HPI  Physical Exam Triage Vital Signs ED Triage Vitals  Enc Vitals Group     BP 01/21/22 1032 105/62     Pulse Rate 01/21/22 1032 (!) 124  Resp 01/21/22 1032 18     Temp 01/21/22 1032 (!) 102.8 F (39.3 C)     Temp Source 01/21/22 1032 Oral     SpO2 01/21/22 1032 97 %     Weight --      Height --      Head Circumference --      Peak Flow --      Pain Score 01/21/22 1034 6     Pain Loc --      Pain Edu? --      Excl. in [place]? --    No data found.  Updated Vital Signs BP 114/66 (BP Location: Right Arm)   Pulse (!) 112   Temp (!) 101.3 F (38.5 C) (Oral)   Resp 18   SpO2 95%   Visual Acuity Right Eye Distance:   Left Eye Distance:   Bilateral Distance:    Right Eye Near:   Left Eye Near:    Bilateral Near:     Physical Exam Vitals and nursing note reviewed.  Constitutional:      General: He is not in acute distress.    Appearance: He is well-developed. He is  not ill-appearing, toxic-appearing or diaphoretic.  HENT:     Head: Normocephalic and atraumatic.     Right Ear: Tympanic membrane, ear canal and external ear normal. No drainage, swelling or tenderness. No middle ear effusion. There is no impacted cerumen. Tympanic membrane is not erythematous.     Left Ear: Tympanic membrane, ear canal and external ear normal. No drainage, swelling or tenderness.  No middle ear effusion. There is no impacted cerumen. Tympanic membrane is not erythematous.     Nose: Nose normal. No congestion or rhinorrhea.     Mouth/Throat:     Mouth: Mucous membranes are dry.     Pharynx: Posterior oropharyngeal erythema present. No uvula swelling.     Tonsils: No tonsillar exudate or tonsillar abscesses. 3+ on the right. 3+ on the left.     Comments: Petechiae posterior palate; oral airway patent Eyes:     General: No scleral icterus.    Extraocular Movements: Extraocular movements intact.  Cardiovascular:     Rate and Rhythm: Regular rhythm. Tachycardia present.  Pulmonary:     Effort: Pulmonary effort is normal. No respiratory distress.     Breath sounds: Normal breath sounds. No wheezing, rhonchi or rales.  Abdominal:     General: Abdomen is flat. Bowel sounds are normal. There is no distension.     Palpations: Abdomen is soft.  Musculoskeletal:     Cervical back: Neck supple.  Lymphadenopathy:     Cervical: No cervical adenopathy.  Skin:    General: Skin is warm and dry.     Capillary Refill: Capillary refill takes less than 2 seconds.     Coloration: Skin is not jaundiced or pale.     Findings: No erythema or rash.  Neurological:     Mental Status: He is alert and oriented to person, place, and time.  Psychiatric:        Behavior: Behavior is cooperative.      UC Treatments / Results  Labs (all labs ordered are listed, but only abnormal results are displayed) Labs Reviewed  POCT RAPID STREP A (OFFICE)  POCT MONO SCREEN ([place])     EKG   Radiology No results found.  Procedures Procedures (including critical care time)  Medications Ordered in UC Medications  acetaminophen (TYLENOL) tablet 1,000 mg (1,000 mg Oral Given 01/21/22  1041)    Initial Impression / Assessment and Plan / UC Course  I have reviewed the triage vital signs and the nursing notes.  Pertinent labs & imaging results that were available during my care of the patient were reviewed by me and considered in my medical decision making (see chart for details).    Patient is a very pleasant, sick appearing 21 year old male presenting for acute pharyngitis today.  No appreciable peritonsillar abscess on exam today.  Initially in triage, he was tachycardic into the 120s and afebrile.  Tylenol was given and heart rate decreased to the low 110s.  Suspect tachycardia is secondary to fever.  He was seen earlier this week and a strep culture was performed which is still pending.  Rapid strep and monoscreen are negative today in urgent care.  Given ongoing symptoms, worsening fever, examination consistent with strep throat and will treat for strep throat with amoxicillin 500 mg twice daily for 10 days.  Encouraged changing toothbrush after starting treatment.  Discussed supportive care.  Seek care if symptoms persist or worsen despite treatment. Final Clinical Impressions(s) / UC Diagnoses   Final diagnoses:  Acute pharyngitis, unspecified etiology     Discharge Instructions      - Rapid strep test and mono test are negative today - You exam and signs are consistent with strep throat - Please start the amoxicillin 500 mg and take it twice daily until the throat culture from earlier this week comes back - Please change your toothbrush today - You can use warm salt water gargles, chloraseptic spray, and cepacol lozenges for the throat pain    ED Prescriptions     Medication Sig Dispense Auth. Provider   amoxicillin (AMOXIL) 500 MG capsule Take 1  capsule (500 mg total) by mouth 2 (two) times daily for 10 days. 20 capsule Valentino Nose, NP      PDMP not reviewed this encounter.   Valentino Nose, NP 01/21/22 1203    Valentino Nose, NP 01/21/22 1205

## 2022-01-21 NOTE — ED Triage Notes (Signed)
Was seen this week for sore throat.  Sore throat continues, now has fever.

## 2022-01-21 NOTE — Discharge Instructions (Addendum)
-   Rapid strep test and mono test are negative today - You exam and signs are consistent with strep throat - Please start the amoxicillin 500 mg and take it twice daily until the throat culture from earlier this week comes back - Please change your toothbrush today - You can use warm salt water gargles, chloraseptic spray, and cepacol lozenges for the throat pain

## 2022-01-22 ENCOUNTER — Other Ambulatory Visit: Payer: Self-pay

## 2022-01-22 ENCOUNTER — Emergency Department (HOSPITAL_COMMUNITY)
Admission: EM | Admit: 2022-01-22 | Discharge: 2022-01-22 | Disposition: A | Discharge status: Home / Self Care | Payer: Medicaid Other | Attending: Emergency Medicine | Admitting: Emergency Medicine

## 2022-01-22 ENCOUNTER — Encounter (HOSPITAL_COMMUNITY): Payer: Self-pay

## 2022-01-22 DIAGNOSIS — J029 Acute pharyngitis, unspecified: Secondary | ICD-10-CM | POA: Diagnosis not present

## 2022-01-22 DIAGNOSIS — Z79899 Other long term (current) drug therapy: Secondary | ICD-10-CM | POA: Diagnosis not present

## 2022-01-22 LAB — CULTURE, GROUP A STREP (THRC)

## 2022-01-22 MED ORDER — DEXAMETHASONE SODIUM PHOSPHATE 10 MG/ML IJ SOLN
10.0000 mg | Freq: Once | INTRAMUSCULAR | Status: AC
  Administered 2022-01-22: 10 mg via INTRAVENOUS
  Filled 2022-01-22: qty 1

## 2022-01-22 MED ORDER — LIDOCAINE VISCOUS HCL 2 % MT SOLN: 15.0000 mL | OROMUCOSAL | 1 refills | Status: DC | PRN

## 2022-01-22 MED ORDER — NAPROXEN 500 MG PO TABS: 500.0000 mg | ORAL_TABLET | Freq: Two times a day (BID) | ORAL | 0 refills | Status: DC

## 2022-01-22 MED ORDER — SODIUM CHLORIDE 0.9 % IV BOLUS
1000.0000 mL | Freq: Once | INTRAVENOUS | Status: AC
  Administered 2022-01-22: 1000 mL via INTRAVENOUS

## 2022-01-22 NOTE — Discharge Instructions (Addendum)
The testing that you have had on your throat showed that you have no signs of strep throat, no signs of mono, we have given you IV fluids to help hydrate you and a dose of steroids which will hopefully help with swelling and pain.  I do want you to see your family doctor within about 3 days for recheck if you are not doing better, ER for severe worsening symptoms.  I have also prescribed a medication called viscous lidocaine which is a thick lidocaine jelly which you can swallow, gargle with it, this should help temporarily with your pain as well.  Continue the antibiotic that you have been prescribed until it is completed  Naprosyn twice daily as needed for pain, this can be obtained at your local pharmacy, I have prescribed it, 500 mg twice a day  Thank you for allowing [place] to treat you in the emergency department today.  After reviewing your examination and potential testing that was done it appears that you are safe to go home.  I would like for you to follow-up with your doctor within the next several days, have them obtain your results and follow-up with them to review all of these tests.  If you should develop severe or worsening symptoms return to the emergency department immediately

## 2022-01-22 NOTE — ED Provider Notes (Signed)
[facility] EMERGENCY DEPARTMENT Provider Note   CSN: [id number] Arrival date & time: 01/22/22  1108     History  Chief Complaint  Patient presents with   Sore Throat    Anthony Wu is a 21 y.o. male.   Sore Throat   This patient is a 21 year old male presenting with a sore throat, this has been going on since Monday, he has been seen at the urgent care most recently yesterday during which time he had negative strep test negative monotest and the negative COVID and flu test.  The sore throat has been persistent, he feels like he is not taking much water because it hurts so much to swallow.  He has been drinking about 1 or slightly more bottles of water per day.  He was a little bit generally fatigued this morning and had some dark-colored urine.  He has been on amoxicillin, states that is not helping that much but he just darted it yesterday.  He denies vomiting or diarrhea    Home Medications Prior to Admission medications   Medication Sig Start Date End Date Taking? Authorizing Provider  lidocaine (XYLOCAINE) 2 % solution Use as directed 15 mLs in the mouth or throat every 4 (four) hours as needed for mouth pain. 01/22/22  Yes Eber Hong, MD  naproxen (NAPROSYN) 500 MG tablet Take 1 tablet (500 mg total) by mouth 2 (two) times daily with a meal. 01/22/22  Yes Eber Hong, MD  amoxicillin (AMOXIL) 500 MG capsule Take 1 capsule (500 mg total) by mouth 2 (two) times daily for 10 days. 01/21/22 01/31/22  Valentino Nose, NP  azelastine (ASTELIN) 0.1 % nasal spray Place 2 sprays into both nostrils 2 (two) times daily. 05/08/19   Alfonse Spruce, MD  betamethasone dipropionate (DIPROLENE) 0.05 % ointment Apply topically 2 (two) times daily. 12/20/20   Domenick Gong, MD  Cetirizine HCl 10 MG CAPS Take 1 capsule (10 mg total) by mouth daily for 10 days. 04/21/20 05/01/20  Wieters, Hallie C, PA-C  hydrOXYzine (ATARAX/VISTARIL) 25 MG tablet Take 1 tablet (25 mg total) by  mouth every 6 (six) hours as needed for itching. 12/20/20   Domenick Gong, MD  fluticasone (FLONASE) 50 MCG/ACT nasal spray Place 1 spray into both nostrils daily for 14 days. 04/08/20 10/12/20  Avegno, Zachery Dakins, FNP  montelukast (SINGULAIR) 10 MG tablet Take 1 tablet (10 mg total) by mouth at bedtime. 05/08/19 10/12/20  Alfonse Spruce, MD      Allergies    Patient has no known allergies.    Review of Systems   Review of Systems  All other systems reviewed and are negative.   Physical Exam Updated Vital Signs BP 137/80 (BP Location: Left Arm)   Pulse (!) 106   Temp 98.8 F (37.1 C) (Oral)   Resp (!) 22   SpO2 97%  Physical Exam Vitals and nursing note reviewed.  Constitutional:      General: He is not in acute distress.    Appearance: He is well-developed.  HENT:     Head: Normocephalic and atraumatic.     Mouth/Throat:     Pharynx: Oropharyngeal exudate and posterior oropharyngeal erythema present.     Tonsils: Tonsillar exudate present.     Comments: The patient has bilateral tonsillar exudate and mild hypertrophy but they are totally symmetrical normal uvula, normal phonation, no trismus or torticollis Eyes:     General: No scleral icterus.       Right eye:  No discharge.        Left eye: No discharge.     Conjunctiva/sclera: Conjunctivae normal.     Pupils: Pupils are equal, round, and reactive to light.  Neck:     Thyroid: No thyromegaly.     Vascular: No JVD.  Cardiovascular:     Rate and Rhythm: Regular rhythm. Tachycardia present.     Heart sounds: Normal heart sounds. No murmur heard.    No friction rub. No gallop.     Comments: Borderline tachycardia, normal pulses Pulmonary:     Effort: Pulmonary effort is normal. No respiratory distress.     Breath sounds: Normal breath sounds. No wheezing or rales.  Abdominal:     General: Bowel sounds are normal. There is no distension.     Palpations: Abdomen is soft. There is no mass.     Tenderness: There  is no abdominal tenderness.  Musculoskeletal:        General: No tenderness. Normal range of motion.     Cervical back: Normal range of motion and neck supple.  Lymphadenopathy:     Cervical: No cervical adenopathy.  Skin:    General: Skin is warm and dry.     Findings: No erythema or rash.  Neurological:     Mental Status: He is alert.     Coordination: Coordination normal.  Psychiatric:        Behavior: Behavior normal.     ED Results / Procedures / Treatments   Labs (all labs ordered are listed, but only abnormal results are displayed) Labs Reviewed - No data to display  EKG None  Radiology No results found.  Procedures Procedures    Medications Ordered in ED Medications  dexamethasone (DECADRON) injection 10 mg (10 mg Intravenous Given 01/22/22 1143)  sodium chloride 0.9 % bolus 1,000 mL (1,000 mLs Intravenous New Bag/Given 01/22/22 1143)    ED Course/ Medical Decision Making/ A&P                           Medical Decision Making Risk Prescription drug management.   I reviewed the medical records including the patient's urgent care visit yesterday with the associated lab testing including the strep and mono.  The patient has vital signs which are reassuring including a normal blood pressure he is afebrile at 98.8 and has a heart rate that is between 101 105.  He does appear dehydrated and has not been taking very much fluids.  We will additionally add Decadron for pain and swelling, specifically there is absolutely no signs of retropharyngeal abscess or peritonsillar abscess.  The patient is agreeable to the plan.  IV fluids and IV Decadron have been given, patient stable for discharge        Final Clinical Impression(s) / ED Diagnoses Final diagnoses:  Acute pharyngitis, unspecified etiology    Rx / DC Orders ED Discharge Orders          Ordered    lidocaine (XYLOCAINE) 2 % solution  Every 4 hours PRN        01/22/22 1213    naproxen (NAPROSYN)  500 MG tablet  2 times daily with meals        01/22/22 1217              Eber Hong, MD 01/22/22 1217

## 2022-01-22 NOTE — ED Triage Notes (Signed)
Sore throat X 1 week. Seen at [facility] Wednesday and Friday, tested for strep and negative result. Given abx, been on for 2 days, throat gotten more sore and swollen. Endorses negative covid and mono.

## 2022-02-24 ENCOUNTER — Ambulatory Visit (INDEPENDENT_AMBULATORY_CARE_PROVIDER_SITE_OTHER): Payer: Medicaid Other | Admitting: Family Medicine

## 2022-02-24 ENCOUNTER — Encounter: Payer: Self-pay | Admitting: Family Medicine

## 2022-02-24 VITALS — BP 128/80 | HR 102 | Temp 99.0°F | Ht 67.0 in | Wt 238.0 lb

## 2022-02-24 DIAGNOSIS — J029 Acute pharyngitis, unspecified: Secondary | ICD-10-CM

## 2022-02-24 LAB — POCT RAPID STREP A (OFFICE): Rapid Strep A Screen: NEGATIVE

## 2022-02-24 MED ORDER — AMOXICILLIN 500 MG PO TABS: 500.0000 mg | ORAL_TABLET | Freq: Two times a day (BID) | ORAL | 0 refills | Status: DC

## 2022-02-24 NOTE — Assessment & Plan Note (Signed)
Rapid strep negative. Awaiting culture. Placing empirically on Amoxicillin.  Advised home COVID testing.

## 2022-02-24 NOTE — Patient Instructions (Signed)
Antibiotic as prescribed while awaiting culture.  Recommend home COVID testing.  Take care  Dr. Adriana Simas

## 2022-02-24 NOTE — Progress Notes (Signed)
Subjective:  Patient ID: Anthony Wu, male    DOB: 12-20-2000  Age: 21 y.o. MRN: [id number]  CC: Neck pain, Sore throat  HPI:  21 year old male presents for evaluation of the above.  Patient reports symptoms started a few days ago. Reports chills, sore throat, neck pain, body aches. No current fever. No known relieving factors. No reported sick contacts. No other complaints at this time.  Patient Active Problem List   Diagnosis Date Noted   Pharyngitis 02/24/2022   Allergic rhinitis 10/24/2012    Social Hx   Social History   Socioeconomic History   Marital status: Single    Spouse name: Not on file   Number of children: Not on file   Years of education: Not on file   Highest education level: Not on file  Occupational History   Not on file  Tobacco Use   Smoking status: Never    Passive exposure: Yes   Smokeless tobacco: Never  Vaping Use   Vaping Use: Never used  Substance and Sexual Activity   Alcohol use: No   Drug use: No   Sexual activity: Not on file  Other Topics Concern   Not on file  Social History Narrative   Not on file   Social Determinants of Health   Financial Resource Strain: Not on file  Food Insecurity: Not on file  Transportation Needs: Not on file  Physical Activity: Not on file  Stress: Not on file  Social Connections: Not on file    Review of Systems Per HPI  Objective:  BP 128/80   Pulse (!) 102   Temp 99 F (37.2 C)   Ht 5\' 7"  (1.702 m)   Wt 238 lb (108 kg)   SpO2 96%   BMI 37.28 kg/m      02/24/2022    4:23 PM 01/22/2022   11:27 AM 01/21/2022   11:19 AM  BP/Weight  Systolic BP 128 137 114  Diastolic BP 80 80 66  Wt. (Lbs) 238    BMI 37.28 kg/m2      Physical Exam Vitals and nursing note reviewed.  HENT:     Head: Normocephalic and atraumatic.     Right Ear: Tympanic membrane normal.     Left Ear: Tympanic membrane normal.     Mouth/Throat:     Pharynx: Oropharyngeal exudate and posterior oropharyngeal  erythema present.  Eyes:     General:        Right eye: No discharge.        Left eye: No discharge.     Conjunctiva/sclera: Conjunctivae normal.  Cardiovascular:     Rate and Rhythm: Regular rhythm. Tachycardia present.  Pulmonary:     Effort: Pulmonary effort is normal.     Breath sounds: Normal breath sounds. No wheezing or rales.  Musculoskeletal:     Cervical back: No rigidity.  Neurological:     Mental Status: He is alert.     Lab Results  Component Value Date   WBC 13.7 (H) 02/26/2019   HGB 15.0 02/26/2019   HCT 45.3 02/26/2019   PLT 261 02/26/2019   GLUCOSE 92 02/26/2019   ALT 44 01/24/2018   AST 33 01/24/2018   NA 137 02/26/2019   K 3.7 02/26/2019   CL 101 02/26/2019   CREATININE 1.25 (H) 02/26/2019   BUN 9 02/26/2019   CO2 24 02/26/2019   TSH 1.505 04/27/2013     Assessment & Plan:   Problem List Items  Addressed This Visit       Respiratory   Pharyngitis - Primary    Rapid strep negative. Awaiting culture. Placing empirically on Amoxicillin.  Advised home COVID testing.       Relevant Medications   amoxicillin (AMOXIL) 500 MG tablet   Other Relevant Orders   Culture, Group A Strep   POCT rapid strep A (Completed)    Meds ordered this encounter  Medications   amoxicillin (AMOXIL) 500 MG tablet    Sig: Take 1 tablet (500 mg total) by mouth 2 (two) times daily.    Dispense:  20 tablet    Refill:  0   Milledge Gerding DO [facility]

## 2022-02-28 LAB — CULTURE, GROUP A STREP: Strep A Culture: NEGATIVE

## 2022-07-02 ENCOUNTER — Other Ambulatory Visit: Payer: Self-pay

## 2022-07-02 ENCOUNTER — Emergency Department (HOSPITAL_COMMUNITY)
Admission: EM | Admit: 2022-07-02 | Discharge: 2022-07-03 | Discharge status: Against Medical Advice | Payer: Medicaid Other | Attending: Emergency Medicine | Admitting: Emergency Medicine

## 2022-07-02 ENCOUNTER — Emergency Department (HOSPITAL_COMMUNITY): Payer: Medicaid Other

## 2022-07-02 DIAGNOSIS — R059 Cough, unspecified: Secondary | ICD-10-CM | POA: Diagnosis not present

## 2022-07-02 DIAGNOSIS — Z20822 Contact with and (suspected) exposure to covid-19: Secondary | ICD-10-CM | POA: Insufficient documentation

## 2022-07-02 DIAGNOSIS — J101 Influenza due to other identified influenza virus with other respiratory manifestations: Secondary | ICD-10-CM | POA: Diagnosis not present

## 2022-07-02 DIAGNOSIS — Z5321 Procedure and treatment not carried out due to patient leaving prior to being seen by health care provider: Secondary | ICD-10-CM | POA: Insufficient documentation

## 2022-07-02 DIAGNOSIS — B974 Respiratory syncytial virus as the cause of diseases classified elsewhere: Secondary | ICD-10-CM | POA: Diagnosis not present

## 2022-07-02 LAB — RESP PANEL BY RT-PCR (RSV, FLU A&B, COVID)  RVPGX2
Influenza A by PCR: POSITIVE — AB
Influenza B by PCR: NEGATIVE
Resp Syncytial Virus by PCR: POSITIVE — AB
SARS Coronavirus 2 by RT PCR: NEGATIVE

## 2022-07-02 NOTE — ED Notes (Addendum)
Pt walked back to bed and expressed that they did want to wait any longer and called family. Armband cut off per pt request.

## 2022-07-02 NOTE — ED Provider Triage Note (Signed)
Emergency Medicine Provider Triage Evaluation Note  Anthony Wu , a 22 y.o. male  was evaluated in triage.  Pt complains of worsening URI symptoms including congestion, productive cough with increased fits, body aches, intermittent fevers and chills.  Recent sick contacts at work.  Denies chest pain or shortness of breath.  Had 1-2 episodes of vomiting with severe coughing fits earlier today.  Still able to swallow without difficulty.  No hx of asthma or COPD.  Review of Systems  Positive:  Negative: See above  Physical Exam  There were no vitals taken for this visit. Gen:   Awake, no distress   Resp:  Normal effort, mild rhonchi MSK:   Moves extremities without difficulty  Other:  Sitting comfortably, productive cough appreciated.  Chest non-TTP.  Medical Decision Making  Medically screening exam initiated at 7:24 PM.  Appropriate orders placed.  [name] was informed that the remainder of the evaluation will be completed by another provider, this initial triage assessment does not replace that evaluation, and the importance of remaining in the ED until their evaluation is complete.     [name], [place] 36/62/94 [id number]

## 2022-07-02 NOTE — ED Triage Notes (Signed)
Patient reports persistent productive cough with fatigue , body aches onset last week.

## 2022-07-05 ENCOUNTER — Ambulatory Visit: Payer: Medicaid Other | Admitting: Family Medicine

## 2022-07-05 ENCOUNTER — Encounter: Payer: Self-pay | Admitting: Family Medicine

## 2022-07-05 VITALS — BP 126/84 | HR 104 | Temp 98.7°F | Wt 234.0 lb

## 2022-07-05 DIAGNOSIS — J019 Acute sinusitis, unspecified: Secondary | ICD-10-CM

## 2022-07-05 MED ORDER — AZITHROMYCIN 250 MG PO TABS: ORAL_TABLET | ORAL | 0 refills | Status: AC

## 2022-07-05 NOTE — Progress Notes (Signed)
   Subjective:    Patient ID: Anthony Wu, male    DOB: 16-Aug-2000, 22 y.o.   MRN: [id number]  HPI Pt arrives due to cough. Cough began last Saturday. Pt had flu like symptoms last week. ER visit on Sunday. COVID/Flu test negative; chest xray negative. Pt states that his abdomen is now hurting due to coughing so much. Productive cough with mucus (Clear to yellowish color)  Frequent coughing Test was positive positive for RSV and flu Review of Systems     Objective:   Physical Exam Gen-NAD not toxic TMS-normal bilateral T- normal no redness Chest-CTA respiratory rate normal no crackles CV RRR no murmur Skin-warm dry Neuro-grossly normal        Assessment & Plan:  RSV and flu Acute rhinosinusitis Antibiotics prescribed warning signs discussed Follow-up if progressive troubles or worse Rest up over the next few days Medication was sent in

## 2022-08-24 ENCOUNTER — Ambulatory Visit
Admission: EM | Admit: 2022-08-24 | Discharge: 2022-08-24 | Disposition: A | Discharge status: Home / Self Care | Payer: Medicaid Other | Attending: Family Medicine | Admitting: Family Medicine

## 2022-08-24 DIAGNOSIS — R509 Fever, unspecified: Secondary | ICD-10-CM | POA: Insufficient documentation

## 2022-08-24 DIAGNOSIS — J069 Acute upper respiratory infection, unspecified: Secondary | ICD-10-CM | POA: Diagnosis not present

## 2022-08-24 DIAGNOSIS — Z1152 Encounter for screening for COVID-19: Secondary | ICD-10-CM | POA: Insufficient documentation

## 2022-08-24 LAB — POCT INFLUENZA A/B
Influenza A, POC: NEGATIVE
Influenza B, POC: NEGATIVE

## 2022-08-24 LAB — POCT RAPID STREP A (OFFICE): Rapid Strep A Screen: NEGATIVE

## 2022-08-24 MED ORDER — PROMETHAZINE-DM 6.25-15 MG/5ML PO SYRP: 5.0000 mL | ORAL_SOLUTION | Freq: Four times a day (QID) | ORAL | 0 refills | Status: DC | PRN

## 2022-08-24 MED ORDER — FLUTICASONE PROPIONATE 50 MCG/ACT NA SUSP: 1.0000 | Freq: Two times a day (BID) | NASAL | 2 refills | Status: DC

## 2022-08-24 NOTE — ED Triage Notes (Signed)
Fatigue, fever, sore throat, nausea, vomiting, body aches, headache that started Sunday. Pt took a Covid home test and was negative.

## 2022-08-24 NOTE — Discharge Instructions (Signed)
Your flu and strep testing was negative today.  I suspect you have COVID, these results should be back in the morning.  Take DayQuil, NyQuil, over-the-counter pain and fever reducers, fluids, and the prescribed medications to help with your symptoms

## 2022-08-24 NOTE — ED Provider Notes (Signed)
[facility]    CSN: [id number] Arrival date & time: 08/24/22  1512      History   Chief Complaint Chief Complaint  Patient presents with   Fever   Sore Throat    HPI Anthony Wu is a 22 y.o. male.   Patient presenting today with 4-day history of fever, chills, body aches, nausea, vomiting, sore throat, congestion, fatigue, headaches.  Denies chest pain, shortness of breath, diarrhea, rashes.  So far taking Aleve with minimal relief.  Took a home COVID test that was negative.  Multiple sick contacts recently.    Past Medical History:  Diagnosis Date   Seasonal allergies     Patient Active Problem List   Diagnosis Date Noted   Pharyngitis 02/24/2022   Allergic rhinitis 10/24/2012    History reviewed. No pertinent surgical history.     Home Medications    Prior to Admission medications   Medication Sig Start Date End Date Taking? Authorizing Provider  fluticasone (FLONASE) 50 MCG/ACT nasal spray Place 1 spray into both nostrils 2 (two) times daily. 08/24/22  Yes Volney American, PA-C  promethazine-dextromethorphan (PROMETHAZINE-DM) 6.25-15 MG/5ML syrup Take 5 mLs by mouth 4 (four) times daily as needed. 08/24/22  Yes Volney American, PA-C    Family History Family History  Problem Relation Age of Onset   Allergic rhinitis Father    COPD Father    Angioedema Neg Hx    Asthma Neg Hx    Atopy Neg Hx    Eczema Neg Hx    Immunodeficiency Neg Hx    Urticaria Neg Hx     Social History Social History   Tobacco Use   Smoking status: Never    Passive exposure: Yes   Smokeless tobacco: Never  Vaping Use   Vaping Use: Never used  Substance Use Topics   Alcohol use: No   Drug use: No     Allergies   Patient has no known allergies.   Review of Systems Review of Systems Per HPI  Physical Exam Triage Vital Signs ED Triage Vitals [08/24/22 1600]  Enc Vitals Group     BP (!) 109/7     Pulse Rate (!) 120     Resp 18      Temp (!) 102.9 F (39.4 C)     Temp Source Oral     SpO2 96 %     Weight      Height      Head Circumference      Peak Flow      Pain Score 6     Pain Loc      Pain Edu?      Excl. in [place]?    No data found.  Updated Vital Signs BP (!) 109/7 (BP Location: Right Arm)   Pulse (!) 120   Temp (!) 102.9 F (39.4 C) (Oral)   Resp 18   SpO2 96%   Visual Acuity Right Eye Distance:   Left Eye Distance:   Bilateral Distance:    Right Eye Near:   Left Eye Near:    Bilateral Near:     Physical Exam Vitals and nursing note reviewed.  Constitutional:      Appearance: He is well-developed.  HENT:     Head: Atraumatic.     Right Ear: External ear normal.     Left Ear: External ear normal.     Nose: Rhinorrhea present. No congestion.     Mouth/Throat:  Pharynx: Posterior oropharyngeal erythema present. No oropharyngeal exudate.  Eyes:     Conjunctiva/sclera: Conjunctivae normal.     Pupils: Pupils are equal, round, and reactive to light.  Cardiovascular:     Rate and Rhythm: Regular rhythm. Tachycardia present.  Pulmonary:     Effort: Pulmonary effort is normal. No respiratory distress.     Breath sounds: No wheezing or rales.  Musculoskeletal:        General: Normal range of motion.     Cervical back: Normal range of motion and neck supple.  Lymphadenopathy:     Cervical: No cervical adenopathy.  Skin:    General: Skin is warm and dry.  Neurological:     Mental Status: He is alert and oriented to person, place, and time.  Psychiatric:        Behavior: Behavior normal.      UC Treatments / Results  Labs (all labs ordered are listed, but only abnormal results are displayed) Labs Reviewed  SARS CORONAVIRUS 2 (TAT 6-24 HRS)  POCT RAPID STREP A (OFFICE)  POCT INFLUENZA A/B    EKG   Radiology No results found.  Procedures Procedures (including critical care time)  Medications Ordered in UC Medications - No data to display  Initial Impression /  Assessment and Plan / UC Course  I have reviewed the triage vital signs and the nursing notes.  Pertinent labs & imaging results that were available during my care of the patient were reviewed by me and considered in my medical decision making (see chart for details).     Febrile and tachycardic in triage, otherwise vital signs reassuring.  Rapid strep and rapid flu both negative, COVID test pending.  Suspect viral upper respiratory infection.  Discussed supportive over-the-counter medications, home care, Phenergan DM, Flonase.  Work note given.  Return for worsening symptoms.  Final Clinical Impressions(s) / UC Diagnoses   Final diagnoses:  Viral URI with cough  Fever, unspecified     Discharge Instructions      Your flu and strep testing was negative today.  I suspect you have COVID, these results should be back in the morning.  Take DayQuil, NyQuil, over-the-counter pain and fever reducers, fluids, and the prescribed medications to help with your symptoms    ED Prescriptions     Medication Sig Dispense Auth. Provider   promethazine-dextromethorphan (PROMETHAZINE-DM) 6.25-15 MG/5ML syrup Take 5 mLs by mouth 4 (four) times daily as needed. 100 mL Volney American, PA-C   fluticasone [facility]) 50 MCG/ACT nasal spray Place 1 spray into both nostrils 2 (two) times daily. 16 g Volney American, [place]      PDMP not reviewed this encounter.   Volney American, [place] 08/24/22 1643

## 2022-08-25 LAB — SARS CORONAVIRUS 2 (TAT 6-24 HRS): SARS Coronavirus 2: NEGATIVE

## 2023-02-09 ENCOUNTER — Encounter: Payer: Self-pay | Admitting: Emergency Medicine

## 2023-02-09 ENCOUNTER — Ambulatory Visit
Admission: EM | Admit: 2023-02-09 | Discharge: 2023-02-09 | Disposition: A | Discharge status: Home / Self Care | Payer: Medicaid Other | Attending: Family Medicine | Admitting: Family Medicine

## 2023-02-09 ENCOUNTER — Other Ambulatory Visit: Payer: Self-pay

## 2023-02-09 DIAGNOSIS — J069 Acute upper respiratory infection, unspecified: Secondary | ICD-10-CM | POA: Diagnosis not present

## 2023-02-09 DIAGNOSIS — R051 Acute cough: Secondary | ICD-10-CM | POA: Insufficient documentation

## 2023-02-09 DIAGNOSIS — Z1152 Encounter for screening for COVID-19: Secondary | ICD-10-CM | POA: Diagnosis not present

## 2023-02-09 DIAGNOSIS — R509 Fever, unspecified: Secondary | ICD-10-CM | POA: Insufficient documentation

## 2023-02-09 MED ORDER — ACETAMINOPHEN 325 MG PO TABS
650.0000 mg | ORAL_TABLET | Freq: Once | ORAL | Status: AC
  Administered 2023-02-09: 650 mg via ORAL

## 2023-02-09 NOTE — ED Triage Notes (Signed)
Pt reports cough, sore throat, body aches, fatigue since Monday.

## 2023-02-09 NOTE — ED Provider Notes (Signed)
[facility]    CSN: [id number] Arrival date & time: 02/09/23  1803      History   Chief Complaint Chief Complaint  Patient presents with   Cough    HPI Anthony Wu is a 22 y.o. male.   Patient presenting today with 4-day history of cough, sore throat, body aches, fatigue, high fevers.  Denies chest pain, shortness of breath, abdominal pain, nausea vomiting or diarrhea.  So far trying over-the-counter pain relievers and DayQuil with minimal relief.  No known sick contacts recently.     Past Medical History:  Diagnosis Date   Seasonal allergies     Patient Active Problem List   Diagnosis Date Noted   Pharyngitis 02/24/2022   Allergic rhinitis 10/24/2012    History reviewed. No pertinent surgical history.     Home Medications    Prior to Admission medications   Medication Sig Start Date End Date Taking? Authorizing Provider  fluticasone (FLONASE) 50 MCG/ACT nasal spray Place 1 spray into both nostrils 2 (two) times daily. 08/24/22   Particia Nearing, PA-C  promethazine-dextromethorphan (PROMETHAZINE-DM) 6.25-15 MG/5ML syrup Take 5 mLs by mouth 4 (four) times daily as needed. 08/24/22   Particia Nearing, PA-C    Family History Family History  Problem Relation Age of Onset   Allergic rhinitis Father    COPD Father    Angioedema Neg Hx    Asthma Neg Hx    Atopy Neg Hx    Eczema Neg Hx    Immunodeficiency Neg Hx    Urticaria Neg Hx     Social History Social History   Tobacco Use   Smoking status: Never    Passive exposure: Yes   Smokeless tobacco: Never  Vaping Use   Vaping status: Never Used  Substance Use Topics   Alcohol use: No   Drug use: No     Allergies   Patient has no known allergies.   Review of Systems Review of Systems Per HPI  Physical Exam Triage Vital Signs ED Triage Vitals  Encounter Vitals Group     BP 02/09/23 1815 123/69     Systolic BP Percentile --      Diastolic BP Percentile --       Pulse Rate 02/09/23 1815 (!) 114     Resp 02/09/23 1815 20     Temp 02/09/23 1815 (!) 102.2 F (39 C)     Temp Source 02/09/23 1815 Oral     SpO2 02/09/23 1815 95 %     Weight --      Height --      Head Circumference --      Peak Flow --      Pain Score 02/09/23 1814 6     Pain Loc --      Pain Education --      Exclude from Growth Chart --    No data found.  Updated Vital Signs BP 123/69 (BP Location: Right Arm)   Pulse (!) 114   Temp (!) 102.2 F (39 C) (Oral)   Resp 20   SpO2 95%   Visual Acuity Right Eye Distance:   Left Eye Distance:   Bilateral Distance:    Right Eye Near:   Left Eye Near:    Bilateral Near:     Physical Exam Vitals and nursing note reviewed.  Constitutional:      Appearance: He is well-developed.  HENT:     Head: Atraumatic.     Right  Ear: External ear normal.     Left Ear: External ear normal.     Nose: Rhinorrhea present.     Mouth/Throat:     Pharynx: Posterior oropharyngeal erythema present. No oropharyngeal exudate.  Eyes:     Conjunctiva/sclera: Conjunctivae normal.     Pupils: Pupils are equal, round, and reactive to light.  Cardiovascular:     Rate and Rhythm: Regular rhythm. Tachycardia present.  Pulmonary:     Effort: Pulmonary effort is normal. No respiratory distress.     Breath sounds: No wheezing or rales.  Musculoskeletal:        General: Normal range of motion.     Cervical back: Normal range of motion and neck supple.  Lymphadenopathy:     Cervical: No cervical adenopathy.  Skin:    General: Skin is warm and dry.  Neurological:     Mental Status: He is alert and oriented to person, place, and time.  Psychiatric:        Behavior: Behavior normal.      UC Treatments / Results  Labs (all labs ordered are listed, but only abnormal results are displayed) Labs Reviewed  SARS CORONAVIRUS 2 (TAT 6-24 HRS)    EKG   Radiology No results found.  Procedures Procedures (including critical care  time)  Medications Ordered in UC Medications  acetaminophen (TYLENOL) tablet 650 mg (650 mg Oral Given 02/09/23 1819)    Initial Impression / Assessment and Plan / UC Course  I have reviewed the triage vital signs and the nursing notes.  Pertinent labs & imaging results that were available during my care of the patient were reviewed by me and considered in my medical decision making (see chart for details).     Febrile and tachycardic in triage, likely secondary to viral illness.  COVID testing pending, discussed supportive over-the-counter medications, home care while awaiting results.  Return for worsening symptoms.  Final Clinical Impressions(s) / UC Diagnoses   Final diagnoses:  Acute cough  Viral URI  Fever, unspecified   Discharge Instructions   None    ED Prescriptions   None    PDMP not reviewed this encounter.   Particia Nearing, [place] 02/09/23 1858

## 2023-02-10 LAB — SARS CORONAVIRUS 2 (TAT 6-24 HRS): SARS Coronavirus 2: NEGATIVE

## 2023-06-07 ENCOUNTER — Other Ambulatory Visit: Payer: Self-pay

## 2023-06-07 ENCOUNTER — Ambulatory Visit
Admission: EM | Admit: 2023-06-07 | Discharge: 2023-06-07 | Disposition: A | Discharge status: Home / Self Care | Payer: Medicaid Other | Attending: Nurse Practitioner | Admitting: Nurse Practitioner

## 2023-06-07 ENCOUNTER — Encounter: Payer: Self-pay | Admitting: Emergency Medicine

## 2023-06-07 DIAGNOSIS — B349 Viral infection, unspecified: Secondary | ICD-10-CM | POA: Insufficient documentation

## 2023-06-07 LAB — POCT RAPID STREP A (OFFICE): Rapid Strep A Screen: NEGATIVE

## 2023-06-07 LAB — POC COVID19/FLU A&B COMBO
Covid Antigen, POC: NEGATIVE
Influenza A Antigen, POC: NEGATIVE
Influenza B Antigen, POC: NEGATIVE

## 2023-06-07 LAB — POCT MONO SCREEN (KUC): Mono, POC: NEGATIVE

## 2023-06-07 MED ORDER — FLUTICASONE PROPIONATE 50 MCG/ACT NA SUSP: 2.0000 | Freq: Every day | NASAL | 0 refills | Status: AC

## 2023-06-07 MED ORDER — ACETAMINOPHEN 325 MG PO TABS
650.0000 mg | ORAL_TABLET | Freq: Once | ORAL | Status: AC
  Administered 2023-06-07: 650 mg via ORAL

## 2023-06-07 MED ORDER — PROMETHAZINE-DM 6.25-15 MG/5ML PO SYRP: 5.0000 mL | ORAL_SOLUTION | Freq: Four times a day (QID) | ORAL | 0 refills | Status: DC | PRN

## 2023-06-07 NOTE — ED Triage Notes (Signed)
Pt reports sore throat, chills, generalized body aches, nausea, nasal drainage since Monday.

## 2023-06-07 NOTE — Discharge Instructions (Addendum)
The COVID/flu test, rapid strep test, and Monospot test are all negative.  A throat culture is pending.  You will be contacted if the pending test results are abnormal.  You also have access to the results via MyChart.  Your symptoms are most likely caused by a virus. Increase fluids and allow for plenty of rest.  Recommend Pedialyte or Gatorolyte to prevent dehydration. Continue alternating Tylenol and ibuprofen as needed for pain, fever, or general discomfort.  As discussed, your last dose of Tylenol was at 4 PM today, you will be due for ibuprofen around 8 PM, and so on. Warm salt water gargles 3-4 times daily as needed for throat pain or discomfort. Recommend a brat diet if nausea returns.  This includes bananas, rice, applesauce, and toast. If symptoms worsen to include high fever, with worsening cough, difficulty breathing, or other concerns, you may follow-up in this clinic for further evaluation. Follow-up as needed.

## 2023-06-07 NOTE — ED Provider Notes (Signed)
[facility]    CSN: [id number] Arrival date & time: 06/07/23  1509      History   Chief Complaint Chief Complaint  Patient presents with   Sore Throat    HPI Anthony Wu is a 22 y.o. male.   The history is provided by the patient.   Patient presenting today with 4-day history of cough, sore throat, body aches, fatigue, high fevers. Denies chest pain, shortness of breath, abdominal pain, nausea vomiting or diarrhea. So far trying over-the-counter pain relievers with minimal relief. No known sick contacts recently.   Past Medical History:  Diagnosis Date   Seasonal allergies     Patient Active Problem List   Diagnosis Date Noted   Pharyngitis 02/24/2022   Allergic rhinitis 10/24/2012    History reviewed. No pertinent surgical history.     Home Medications    Prior to Admission medications   Medication Sig Start Date End Date Taking? Authorizing Provider  fluticasone (FLONASE) 50 MCG/ACT nasal spray Place 2 sprays into both nostrils daily. 06/07/23  Yes Leath-Warren, Sadie Haber, NP  promethazine-dextromethorphan (PROMETHAZINE-DM) 6.25-15 MG/5ML syrup Take 5 mLs by mouth 4 (four) times daily as needed. 06/07/23  Yes Leath-Warren, Sadie Haber, NP    Family History Family History  Problem Relation Age of Onset   Allergic rhinitis Father    COPD Father    Angioedema Neg Hx    Asthma Neg Hx    Atopy Neg Hx    Eczema Neg Hx    Immunodeficiency Neg Hx    Urticaria Neg Hx     Social History Social History   Tobacco Use   Smoking status: Never    Passive exposure: Yes   Smokeless tobacco: Never  Vaping Use   Vaping status: Never Used  Substance Use Topics   Alcohol use: No   Drug use: No     Allergies   Patient has no known allergies.   Review of Systems Review of Systems Per HPI  Physical Exam Triage Vital Signs ED Triage Vitals  Encounter Vitals Group     BP 06/07/23 1549 123/81     Systolic BP Percentile --       Diastolic BP Percentile --      Pulse Rate 06/07/23 1549 (!) 123     Resp 06/07/23 1549 20     Temp 06/07/23 1549 (!) 103 F (39.4 C)     Temp Source 06/07/23 1549 Oral     SpO2 06/07/23 1549 92 %     Weight --      Height --      Head Circumference --      Peak Flow --      Pain Score 06/07/23 1550 7     Pain Loc --      Pain Education --      Exclude from Growth Chart --    No data found.  Updated Vital Signs BP 123/81 (BP Location: Right Arm)   Pulse (!) 113   Temp 100 F (37.8 C) (Oral)   Resp 20   SpO2 95%   Visual Acuity Right Eye Distance:   Left Eye Distance:   Bilateral Distance:    Right Eye Near:   Left Eye Near:    Bilateral Near:     Physical Exam Vitals and nursing note reviewed.  Constitutional:      General: He is not in acute distress.    Appearance: He is well-developed.  HENT:  Head: Normocephalic.     Right Ear: Tympanic membrane and ear canal normal.     Left Ear: Tympanic membrane and ear canal normal.     Nose: Congestion present.     Mouth/Throat:     Mouth: Mucous membranes are moist.     Pharynx: Pharyngeal swelling and posterior oropharyngeal erythema present.     Tonsils: No tonsillar exudate or tonsillar abscesses. 1+ on the right. 1+ on the left.  Eyes:     Conjunctiva/sclera: Conjunctivae normal.     Pupils: Pupils are equal, round, and reactive to light.  Cardiovascular:     Rate and Rhythm: Tachycardia present.     Pulses: Normal pulses.     Heart sounds: Normal heart sounds.  Pulmonary:     Effort: Pulmonary effort is normal. No respiratory distress.     Breath sounds: Normal breath sounds. No stridor. No wheezing, rhonchi or rales.  Abdominal:     General: Bowel sounds are normal.     Palpations: Abdomen is soft.     Tenderness: There is no abdominal tenderness.  Musculoskeletal:     Cervical back: Normal range of motion.  Lymphadenopathy:     Cervical: No cervical adenopathy.  Skin:    General: Skin is warm and  dry.  Neurological:     General: No focal deficit present.     Mental Status: He is alert.  Psychiatric:        Mood and Affect: Mood normal.        Behavior: Behavior normal.      UC Treatments / Results  Labs (all labs ordered are listed, but only abnormal results are displayed) Labs Reviewed  CULTURE, GROUP A STREP [facility])  POCT RAPID STREP A (OFFICE)  POC COVID19/FLU A&B COMBO  POCT MONO SCREEN ([facility])    EKG   Radiology No results found.  Procedures Procedures (including critical care time)  Medications Ordered in UC Medications  acetaminophen (TYLENOL) tablet 650 mg (650 mg Oral Given 06/07/23 1557)    Initial Impression / Assessment and Plan / UC Course  I have reviewed the triage vital signs and the nursing notes.  Pertinent labs & imaging results that were available during my care of the patient were reviewed by me and considered in my medical decision making (see chart for details).  The rapid strep test, COVID/flu test, and Monospot test were negative.  Acetaminophen 650 mg administered with good improvement of fever.  Symptoms most likely due to an underlying viral etiology.  Symptomatic treatment was provided with fluticasone 50 micro nasal spray for nasal congestion, and Promethazine DM for cough and nausea.  Supportive care recommendations were provided and discussed with the patient to include fluids, rest, over-the-counter analgesics, warm salt water gargles, and Pedialyte to prevent dehydration.  Patient was given strict follow-up precautions.  Patient was in agreement with this plan of care and verbalizes understanding.  All questions were answered.  Patient stable for discharge.  Work note was provided.  Final Clinical Impressions(s) / UC Diagnoses   Final diagnoses:  Viral illness     Discharge Instructions      The COVID/flu test, rapid strep test, and Monospot test are all negative.  A throat culture is pending.  You will be contacted if the  pending test results are abnormal.  You also have access to the results via MyChart.  Your symptoms are most likely caused by a virus. Increase fluids and allow for plenty of rest.  Recommend Pedialyte  or Gatorolyte to prevent dehydration. Continue alternating Tylenol and ibuprofen as needed for pain, fever, or general discomfort.  As discussed, your last dose of Tylenol was at 4 PM today, you will be due for ibuprofen around 8 PM, and so on. Warm salt water gargles 3-4 times daily as needed for throat pain or discomfort. Recommend a brat diet if nausea returns.  This includes bananas, rice, applesauce, and toast. If symptoms worsen to include high fever, with worsening cough, difficulty breathing, or other concerns, you may follow-up in this clinic for further evaluation. Follow-up as needed.     ED Prescriptions     Medication Sig Dispense Auth. Provider   promethazine-dextromethorphan (PROMETHAZINE-DM) 6.25-15 MG/5ML syrup Take 5 mLs by mouth 4 (four) times daily as needed. 118 mL Leath-Warren, Sadie Haber, NP   fluticasone (FLONASE) 50 MCG/ACT nasal spray Place 2 sprays into both nostrils daily. 16 g Leath-Warren, Sadie Haber, NP      PDMP not reviewed this encounter.   Abran Cantor, NP 06/07/23 1737

## 2023-06-10 LAB — CULTURE, GROUP A STREP (THRC)

## 2024-02-15 ENCOUNTER — Encounter: Payer: Self-pay | Admitting: Emergency Medicine

## 2024-02-15 ENCOUNTER — Ambulatory Visit
Admission: EM | Admit: 2024-02-15 | Discharge: 2024-02-15 | Disposition: A | Discharge status: Home / Self Care | Attending: Family Medicine | Admitting: Family Medicine

## 2024-02-15 DIAGNOSIS — U071 COVID-19: Secondary | ICD-10-CM

## 2024-02-15 LAB — POC SOFIA SARS ANTIGEN FIA: SARS Coronavirus 2 Ag: POSITIVE — AB

## 2024-02-15 MED ORDER — PAXLOVID (300/100) 20 X 150 MG & 10 X 100MG PO TBPK: 3.0000 | ORAL_TABLET | Freq: Two times a day (BID) | ORAL | 0 refills | Status: AC

## 2024-02-15 MED ORDER — PROMETHAZINE-DM 6.25-15 MG/5ML PO SYRP: 5.0000 mL | ORAL_SOLUTION | Freq: Four times a day (QID) | ORAL | 0 refills | Status: DC | PRN

## 2024-02-15 MED ORDER — AZELASTINE HCL 0.1 % NA SOLN: 1.0000 | Freq: Two times a day (BID) | NASAL | 0 refills | Status: DC

## 2024-02-15 NOTE — ED Provider Notes (Signed)
 [facility]    CSN: [id number] Arrival date & time: 02/15/24  1124      History   Chief Complaint No chief complaint on file.   HPI Anthony Wu is a 23 y.o. male.   Patient presenting today with 1 day history of fever, sore throat, productive cough, congestion.  Denies chest pain, shortness of breath, abdominal pain, vomiting, diarrhea.  So far not trying anything over-the-counter for symptoms.  No known sick contacts recently.    Past Medical History:  Diagnosis Date   Seasonal allergies     Patient Active Problem List   Diagnosis Date Noted   Pharyngitis 02/24/2022   Allergic rhinitis 10/24/2012    History reviewed. No pertinent surgical history.     Home Medications    Prior to Admission medications   Medication Sig Start Date End Date Taking? Authorizing Provider  azelastine  (ASTELIN ) 0.1 % nasal spray Place 1 spray into both nostrils 2 (two) times daily. Use in each nostril as directed 02/15/24  Yes Stuart Vernell Norris, PA-C  nirmatrelvir/ritonavir (PAXLOVID , 300/100,) 20 x 150 MG & 10 x 100MG  TBPK Take 3 tablets by mouth 2 (two) times daily for 5 days. Patient GFR is >60. Take nirmatrelvir (150 mg) two tablets twice daily for 5 days and ritonavir (100 mg) one tablet twice daily for 5 days. 02/15/24 02/20/24 Yes Stuart Vernell Norris, PA-C  promethazine -dextromethorphan (PROMETHAZINE -DM) 6.25-15 MG/5ML syrup Take 5 mLs by mouth 4 (four) times daily as needed. 02/15/24  Yes Stuart Vernell Norris, PA-C  fluticasone  (FLONASE ) 50 MCG/ACT nasal spray Place 2 sprays into both nostrils daily. 06/07/23   Leath-Warren, Etta PARAS, NP    Family History Family History  Problem Relation Age of Onset   Allergic rhinitis Father    COPD Father    Angioedema Neg Hx    Asthma Neg Hx    Atopy Neg Hx    Eczema Neg Hx    Immunodeficiency Neg Hx    Urticaria Neg Hx     Social History Social History   Tobacco Use   Smoking status: Never    Passive  exposure: Yes   Smokeless tobacco: Never  Vaping Use   Vaping status: Never Used  Substance Use Topics   Alcohol use: No   Drug use: No     Allergies   Patient has no known allergies.   Review of Systems Review of Systems Per HPI  Physical Exam Triage Vital Signs ED Triage Vitals  Encounter Vitals Group     BP 02/15/24 1132 (!) 143/83     Girls Systolic BP Percentile --      Girls Diastolic BP Percentile --      Boys Systolic BP Percentile --      Boys Diastolic BP Percentile --      Pulse Rate 02/15/24 1132 92     Resp 02/15/24 1132 20     Temp 02/15/24 1132 99.3 F (37.4 C)     Temp Source 02/15/24 1132 Oral     SpO2 02/15/24 1132 97 %     Weight --      Height --      Head Circumference --      Peak Flow --      Pain Score 02/15/24 1133 0     Pain Loc --      Pain Education --      Exclude from Growth Chart --    No data found.  Updated Vital Signs BP ROLLEN)  143/83 (BP Location: Right Arm)   Pulse 92   Temp 99.3 F (37.4 C) (Oral)   Resp 20   SpO2 97%   Visual Acuity Right Eye Distance:   Left Eye Distance:   Bilateral Distance:    Right Eye Near:   Left Eye Near:    Bilateral Near:     Physical Exam Vitals and nursing note reviewed.  Constitutional:      Appearance: He is well-developed.  HENT:     Head: Atraumatic.     Right Ear: External ear normal.     Left Ear: External ear normal.     Nose: Rhinorrhea present.     Mouth/Throat:     Pharynx: Posterior oropharyngeal erythema present. No oropharyngeal exudate.  Eyes:     Conjunctiva/sclera: Conjunctivae normal.     Pupils: Pupils are equal, round, and reactive to light.  Cardiovascular:     Rate and Rhythm: Normal rate and regular rhythm.  Pulmonary:     Effort: Pulmonary effort is normal. No respiratory distress.     Breath sounds: No wheezing or rales.  Musculoskeletal:        General: Normal range of motion.     Cervical back: Normal range of motion and neck supple.   Lymphadenopathy:     Cervical: No cervical adenopathy.  Skin:    General: Skin is warm and dry.  Neurological:     Mental Status: He is alert and oriented to person, place, and time.  Psychiatric:        Behavior: Behavior normal.      UC Treatments / Results  Labs (all labs ordered are listed, but only abnormal results are displayed) Labs Reviewed  POC SOFIA SARS ANTIGEN FIA - Abnormal; Notable for the following components:      Result Value   SARS Coronavirus 2 Ag Positive (*)    All other components within normal limits    EKG   Radiology No results found.  Procedures Procedures (including critical care time)  Medications Ordered in UC Medications - No data to display  Initial Impression / Assessment and Plan / UC Course  I have reviewed the triage vital signs and the nursing notes.  Pertinent labs & imaging results that were available during my care of the patient were reviewed by me and considered in my medical decision making (see chart for details).     Minimally hypertensive in triage, otherwise vital signs within normal limits.  He is well-appearing and in no acute distress.  Rapid COVID-positive, treat with Paxlovid , Phenergan  DM, Astelin , supportive over-the-counter medications and home care.  Return for worsening symptoms.  Final Clinical Impressions(s) / UC Diagnoses   Final diagnoses:  COVID-19   Discharge Instructions   None    ED Prescriptions     Medication Sig Dispense Auth. Provider   nirmatrelvir/ritonavir (PAXLOVID , 300/100,) 20 x 150 MG & 10 x 100MG  TBPK Take 3 tablets by mouth 2 (two) times daily for 5 days. Patient GFR is >60. Take nirmatrelvir (150 mg) two tablets twice daily for 5 days and ritonavir (100 mg) one tablet twice daily for 5 days. 30 tablet Stuart Vernell Norris, PA-C   promethazine -dextromethorphan (PROMETHAZINE -DM) 6.25-15 MG/5ML syrup Take 5 mLs by mouth 4 (four) times daily as needed. 100 mL Stuart Vernell Norris,  PA-C   azelastine  (ASTELIN ) 0.1 % nasal spray Place 1 spray into both nostrils 2 (two) times daily. Use in each nostril as directed 30 mL Stuart Vernell Norris, PA-C  PDMP not reviewed this encounter.   Stuart Vernell Norris, [place] 02/15/24 1206

## 2024-02-15 NOTE — ED Triage Notes (Signed)
 Scratchy throat, fever, chest congestion since last night.

## 2024-03-25 ENCOUNTER — Ambulatory Visit
Admission: EM | Admit: 2024-03-25 | Discharge: 2024-03-25 | Disposition: A | Discharge status: Home / Self Care | Attending: Family Medicine | Admitting: Family Medicine

## 2024-03-25 DIAGNOSIS — J069 Acute upper respiratory infection, unspecified: Secondary | ICD-10-CM | POA: Diagnosis not present

## 2024-03-25 MED ORDER — PROMETHAZINE-DM 6.25-15 MG/5ML PO SYRP
5.0000 mL | ORAL_SOLUTION | Freq: Four times a day (QID) | ORAL | 0 refills | Status: DC | PRN
Start: 2024-03-25 — End: 2024-05-21

## 2024-03-25 MED ORDER — AZELASTINE HCL 0.1 % NA SOLN: 1.0000 | Freq: Two times a day (BID) | NASAL | 0 refills | Status: DC

## 2024-03-25 NOTE — ED Provider Notes (Signed)
 [facility]    CSN: [id number] Arrival date & time: 03/25/24  1256      History   Chief Complaint No chief complaint on file.   HPI Anthony Wu is a 23 y.o. male.   Patient presenting today with 2-day history of cough, congestion, sore throat, sinus pressure.  Denies fever, chills, chest pain, shortness of breath, abdominal pain, vomiting, diarrhea.  So far trying over-the-counter cold and congestion medication with minimal relief.     Past Medical History:  Diagnosis Date   Seasonal allergies     Patient Active Problem List   Diagnosis Date Noted   Pharyngitis 02/24/2022   Allergic rhinitis 10/24/2012    History reviewed. No pertinent surgical history.     Home Medications    Prior to Admission medications   Medication Sig Start Date End Date Taking? Authorizing Provider  azelastine  (ASTELIN ) 0.1 % nasal spray Place 1 spray into both nostrils 2 (two) times daily. Use in each nostril as directed 03/25/24   Stuart Vernell Norris, PA-C  fluticasone  (FLONASE ) 50 MCG/ACT nasal spray Place 2 sprays into both nostrils daily. 06/07/23   Leath-Warren, Etta PARAS, NP  promethazine -dextromethorphan (PROMETHAZINE -DM) 6.25-15 MG/5ML syrup Take 5 mLs by mouth 4 (four) times daily as needed. 03/25/24   Stuart Vernell Norris, PA-C    Family History Family History  Problem Relation Age of Onset   Allergic rhinitis Father    COPD Father    Angioedema Neg Hx    Asthma Neg Hx    Atopy Neg Hx    Eczema Neg Hx    Immunodeficiency Neg Hx    Urticaria Neg Hx     Social History Social History   Tobacco Use   Smoking status: Never    Passive exposure: Yes   Smokeless tobacco: Never  Vaping Use   Vaping status: Never Used  Substance Use Topics   Alcohol use: No   Drug use: No     Allergies   Patient has no known allergies.   Review of Systems Review of Systems PER HPI  Physical Exam Triage Vital Signs ED Triage Vitals  Encounter Vitals  Group     BP 03/25/24 1449 129/83     Girls Systolic BP Percentile --      Girls Diastolic BP Percentile --      Boys Systolic BP Percentile --      Boys Diastolic BP Percentile --      Pulse Rate 03/25/24 1449 93     Resp 03/25/24 1449 20     Temp 03/25/24 1449 99.2 F (37.3 C)     Temp Source 03/25/24 1449 Oral     SpO2 03/25/24 1449 96 %     Weight --      Height --      Head Circumference --      Peak Flow --      Pain Score 03/25/24 1451 0     Pain Loc --      Pain Education --      Exclude from Growth Chart --    No data found.  Updated Vital Signs BP 129/83 (BP Location: Right Arm)   Pulse 93   Temp 99.2 F (37.3 C) (Oral)   Resp 20   SpO2 96%   Visual Acuity Right Eye Distance:   Left Eye Distance:   Bilateral Distance:    Right Eye Near:   Left Eye Near:    Bilateral Near:  Physical Exam Vitals and nursing note reviewed.  Constitutional:      Appearance: He is well-developed.  HENT:     Head: Atraumatic.     Right Ear: External ear normal.     Left Ear: External ear normal.     Nose: Rhinorrhea present.     Mouth/Throat:     Pharynx: Posterior oropharyngeal erythema present. No oropharyngeal exudate.  Eyes:     Conjunctiva/sclera: Conjunctivae normal.     Pupils: Pupils are equal, round, and reactive to light.  Cardiovascular:     Rate and Rhythm: Normal rate and regular rhythm.  Pulmonary:     Effort: Pulmonary effort is normal. No respiratory distress.     Breath sounds: No wheezing or rales.  Musculoskeletal:        General: Normal range of motion.     Cervical back: Normal range of motion and neck supple.  Lymphadenopathy:     Cervical: No cervical adenopathy.  Skin:    General: Skin is warm and dry.  Neurological:     Mental Status: He is alert and oriented to person, place, and time.  Psychiatric:        Behavior: Behavior normal.      UC Treatments / Results  Labs (all labs ordered are listed, but only abnormal results are  displayed) Labs Reviewed - No data to display  EKG   Radiology No results found.  Procedures Procedures (including critical care time)  Medications Ordered in UC Medications - No data to display  Initial Impression / Assessment and Plan / UC Course  I have reviewed the triage vital signs and the nursing notes.  Pertinent labs & imaging results that were available during my care of the patient were reviewed by me and considered in my medical decision making (see chart for details).     Vitals and exam reassuring today, suspect viral respiratory infection.  Treat with Astelin , Phenergan  DM, supportive over-the-counter medications and home care.  Return for worsening symptoms.  Final Clinical Impressions(s) / UC Diagnoses   Final diagnoses:  Viral URI with cough   Discharge Instructions   None    ED Prescriptions     Medication Sig Dispense Auth. Provider   azelastine  (ASTELIN ) 0.1 % nasal spray Place 1 spray into both nostrils 2 (two) times daily. Use in each nostril as directed 30 mL Stuart Vernell Norris, PA-C   promethazine -dextromethorphan (PROMETHAZINE -DM) 6.25-15 MG/5ML syrup Take 5 mLs by mouth 4 (four) times daily as needed. 100 mL Stuart Vernell Norris, [place]      PDMP not reviewed this encounter.   Stuart Vernell Norris, PA-C 03/25/24 1547

## 2024-03-25 NOTE — ED Triage Notes (Signed)
 Pt reports cough, congestion,sore  throat, sinus pressure x 2 days. Pt states he has tried Advil  cold and sinus has found no relief.

## 2024-05-21 ENCOUNTER — Ambulatory Visit
Admission: EM | Admit: 2024-05-21 | Discharge: 2024-05-21 | Disposition: A | Discharge status: Home / Self Care | Attending: Family Medicine | Admitting: Family Medicine

## 2024-05-21 ENCOUNTER — Encounter: Payer: Self-pay | Admitting: Emergency Medicine

## 2024-05-21 DIAGNOSIS — J069 Acute upper respiratory infection, unspecified: Secondary | ICD-10-CM

## 2024-05-21 LAB — POC COVID19/FLU A&B COMBO
Covid Antigen, POC: NEGATIVE
Influenza A Antigen, POC: NEGATIVE
Influenza B Antigen, POC: NEGATIVE

## 2024-05-21 NOTE — ED Triage Notes (Signed)
 Body aches and sore throat x 2 days.  Feels pressure in right ear at times.

## 2024-05-21 NOTE — ED Provider Notes (Signed)
 [facility]    CSN: [id number] Arrival date & time: 05/21/24  1445      History   Chief Complaint No chief complaint on file.   HPI Anthony Wu is a 23 y.o. male.   Patient presenting today with 2-day history of sore throat, body aches, mild congestion, right ear pressure, fatigue.  Denies fevers, chest pain, shortness of breath, abdominal pain, nausea vomiting or diarrhea.  So far trying ibuprofen  with minimal relief.    Past Medical History:  Diagnosis Date   Seasonal allergies     Patient Active Problem List   Diagnosis Date Noted   Pharyngitis 02/24/2022   Allergic rhinitis 10/24/2012    History reviewed. No pertinent surgical history.     Home Medications    Prior to Admission medications   Medication Sig Start Date End Date Taking? Authorizing Provider  fluticasone  (FLONASE ) 50 MCG/ACT nasal spray Place 2 sprays into both nostrils daily. 06/07/23   Leath-Warren, Etta PARAS, NP    Family History Family History  Problem Relation Age of Onset   Allergic rhinitis Father    COPD Father    Angioedema Neg Hx    Asthma Neg Hx    Atopy Neg Hx    Eczema Neg Hx    Immunodeficiency Neg Hx    Urticaria Neg Hx     Social History Social History   Tobacco Use   Smoking status: Never    Passive exposure: Yes   Smokeless tobacco: Never  Vaping Use   Vaping status: Never Used  Substance Use Topics   Alcohol use: No   Drug use: No     Allergies   Patient has no known allergies.   Review of Systems Review of Systems PER HPI  Physical Exam Triage Vital Signs ED Triage Vitals  Encounter Vitals Group     BP 05/21/24 1504 126/88     Girls Systolic BP Percentile --      Girls Diastolic BP Percentile --      Boys Systolic BP Percentile --      Boys Diastolic BP Percentile --      Pulse Rate 05/21/24 1504 99     Resp 05/21/24 1504 18     Temp 05/21/24 1504 98.6 F (37 C)     Temp Source 05/21/24 1504 Oral     SpO2 05/21/24 1504  95 %     Weight --      Height --      Head Circumference --      Peak Flow --      Pain Score 05/21/24 1505 3     Pain Loc --      Pain Education --      Exclude from Growth Chart --    No data found.  Updated Vital Signs BP 126/88 (BP Location: Right Arm)   Pulse 99   Temp 98.6 F (37 C) (Oral)   Resp 18   SpO2 95%   Visual Acuity Right Eye Distance:   Left Eye Distance:   Bilateral Distance:    Right Eye Near:   Left Eye Near:    Bilateral Near:     Physical Exam Vitals and nursing note reviewed.  Constitutional:      Appearance: He is well-developed.  HENT:     Head: Atraumatic.     Right Ear: Tympanic membrane and external ear normal.     Left Ear: Tympanic membrane and external ear normal.  Nose: Rhinorrhea present.     Mouth/Throat:     Pharynx: Posterior oropharyngeal erythema present. No oropharyngeal exudate.  Eyes:     Conjunctiva/sclera: Conjunctivae normal.     Pupils: Pupils are equal, round, and reactive to light.  Cardiovascular:     Rate and Rhythm: Normal rate and regular rhythm.  Pulmonary:     Effort: Pulmonary effort is normal. No respiratory distress.     Breath sounds: No wheezing or rales.  Musculoskeletal:        General: Normal range of motion.     Cervical back: Normal range of motion and neck supple.  Lymphadenopathy:     Cervical: No cervical adenopathy.  Skin:    General: Skin is warm and dry.  Neurological:     Mental Status: He is alert and oriented to person, place, and time.  Psychiatric:        Behavior: Behavior normal.      UC Treatments / Results  Labs (all labs ordered are listed, but only abnormal results are displayed) Labs Reviewed  POC COVID19/FLU A&B COMBO    EKG   Radiology No results found.  Procedures Procedures (including critical care time)  Medications Ordered in UC Medications - No data to display  Initial Impression / Assessment and Plan / UC Course  I have reviewed the triage  vital signs and the nursing notes.  Pertinent labs & imaging results that were available during my care of the patient were reviewed by me and considered in my medical decision making (see chart for details).     Vitals and exam reassuring today, rapid flu and COVID-negative.  Suspect viral respiratory infection.  Treat with supportive over-the-counter medications, home care and return precautions viewed.  Work note given.  Final Clinical Impressions(s) / UC Diagnoses   Final diagnoses:  Viral URI   Discharge Instructions   None    ED Prescriptions   None    PDMP not reviewed this encounter.   Stuart Vernell Norris, [place] 05/21/24 [phone]

## 2024-07-08 ENCOUNTER — Ambulatory Visit
Admission: EM | Admit: 2024-07-08 | Discharge: 2024-07-08 | Disposition: A | Discharge status: Home / Self Care | Source: Home / Self Care

## 2024-07-08 DIAGNOSIS — B349 Viral infection, unspecified: Secondary | ICD-10-CM | POA: Diagnosis not present

## 2024-07-08 DIAGNOSIS — R6889 Other general symptoms and signs: Secondary | ICD-10-CM | POA: Diagnosis not present

## 2024-07-08 LAB — POCT INFLUENZA A/B
Influenza A, POC: NEGATIVE
Influenza B, POC: NEGATIVE

## 2024-07-08 LAB — POC SOFIA SARS ANTIGEN FIA: SARS Coronavirus 2 Ag: NEGATIVE

## 2024-07-08 MED ORDER — OSELTAMIVIR PHOSPHATE 75 MG PO CAPS: 75.0000 mg | ORAL_CAPSULE | Freq: Two times a day (BID) | ORAL | 0 refills | Status: AC

## 2024-07-08 MED ORDER — AZELASTINE HCL 0.1 % NA SOLN: 2.0000 | Freq: Two times a day (BID) | NASAL | 0 refills | Status: AC

## 2024-07-08 NOTE — Discharge Instructions (Signed)
 The COVID test and influenza test were negative.  Despite the negative test, I have elected to treat you with Tamiflu  for your flulike symptoms. Take medication as prescribed. Increase fluids and allow for plenty of rest. You may take over-the-counter Tylenol  or ibuprofen  as needed for pain, fever, or general discomfort. Recommend normal saline nasal spray throughout the day for nasal congestion or runny nose. For your cough, you may take over-the-counter cough medication such as Robitussin, Mucinex , or Delsym.  You may also find it helpful to sleep elevated and to use a humidifier at nighttime. You should remain home if you develop a fever.  You should remain home until you have been fever free for 24 hours with no medication. Follow-up as needed.

## 2024-07-08 NOTE — ED Provider Notes (Signed)
 " [facility]    CSN: [id number] Arrival date & time: 07/08/24  1430      History   Chief Complaint No chief complaint on file.   HPI NAVIN DOGAN is a 24 y.o. male.   The history is provided by the patient.   Patient presents with a 2-day history of bodyaches, cough, and head congestion.  He also states that he has episodes of feeling hot.  He denies ear pain, ear drainage, sore throat, wheezing, difficulty breathing, abdominal pain, nausea, vomiting, diarrhea, or rash.  Patient denies any recent close sick contacts.  States that he did take ibuprofen  on 1 occasion for his symptoms.  Past Medical History:  Diagnosis Date   Seasonal allergies     Patient Active Problem List   Diagnosis Date Noted   Pharyngitis 02/24/2022   Allergic rhinitis 10/24/2012    History reviewed. No pertinent surgical history.     Home Medications    Prior to Admission medications  Medication Sig Start Date End Date Taking? Authorizing Provider  fluticasone  (FLONASE ) 50 MCG/ACT nasal spray Place 2 sprays into both nostrils daily. 06/07/23   Leath-Warren, Etta PARAS, NP    Family History Family History  Problem Relation Age of Onset   Allergic rhinitis Father    COPD Father    Angioedema Neg Hx    Asthma Neg Hx    Atopy Neg Hx    Eczema Neg Hx    Immunodeficiency Neg Hx    Urticaria Neg Hx     Social History Social History[1]   Allergies   Patient has no known allergies.   Review of Systems Review of Systems Per HPI  Physical Exam Triage Vital Signs ED Triage Vitals  Encounter Vitals Group     BP 07/08/24 1515 136/88     Girls Systolic BP Percentile --      Girls Diastolic BP Percentile --      Boys Systolic BP Percentile --      Boys Diastolic BP Percentile --      Pulse Rate 07/08/24 1515 (!) 108     Resp 07/08/24 1515 20     Temp 07/08/24 1515 98.9 F (37.2 C)     Temp Source 07/08/24 1515 Oral     SpO2 07/08/24 1515 96 %     Weight --       Height --      Head Circumference --      Peak Flow --      Pain Score 07/08/24 1516 6     Pain Loc --      Pain Education --      Exclude from Growth Chart --    No data found.  Updated Vital Signs BP 136/88 (BP Location: Right Arm)   Pulse (!) 108   Temp 98.9 F (37.2 C) (Oral)   Resp 20   SpO2 96%   Visual Acuity Right Eye Distance:   Left Eye Distance:   Bilateral Distance:    Right Eye Near:   Left Eye Near:    Bilateral Near:     Physical Exam Vitals and nursing note reviewed.  Constitutional:      General: He is not in acute distress.    Appearance: Normal appearance.  HENT:     Head: Normocephalic.     Right Ear: Tympanic membrane, ear canal and external ear normal.     Left Ear: Tympanic membrane, ear canal and external ear normal.  Nose: Congestion present.     Right Turbinates: Enlarged and swollen.     Left Turbinates: Enlarged and swollen.     Right Sinus: No maxillary sinus tenderness or frontal sinus tenderness.     Left Sinus: No maxillary sinus tenderness or frontal sinus tenderness.     Mouth/Throat:     Lips: Pink.     Mouth: Mucous membranes are moist.     Pharynx: Postnasal drip present. No pharyngeal swelling, oropharyngeal exudate, posterior oropharyngeal erythema or uvula swelling.     Comments: Cobblestoning present to posterior oropharynx  Eyes:     Extraocular Movements: Extraocular movements intact.     Conjunctiva/sclera: Conjunctivae normal.     Pupils: Pupils are equal, round, and reactive to light.  Cardiovascular:     Rate and Rhythm: Regular rhythm. Tachycardia present.     Pulses: Normal pulses.     Heart sounds: Normal heart sounds.  Pulmonary:     Effort: Pulmonary effort is normal. No respiratory distress.     Breath sounds: Normal breath sounds. No stridor. No wheezing, rhonchi or rales.  Abdominal:     General: Bowel sounds are normal.     Palpations: Abdomen is soft.  Musculoskeletal:     Cervical back: Normal  range of motion.  Skin:    General: Skin is warm and dry.  Neurological:     General: No focal deficit present.     Mental Status: He is alert and oriented to person, place, and time.  Psychiatric:        Mood and Affect: Mood normal.        Behavior: Behavior normal.      UC Treatments / Results  Labs (all labs ordered are listed, but only abnormal results are displayed) Labs Reviewed  POC SOFIA SARS ANTIGEN FIA - Normal  POCT INFLUENZA A/B - Normal    EKG   Radiology No results found.  Procedures Procedures (including critical care time)  Medications Ordered in UC Medications - No data to display  Initial Impression / Assessment and Plan / UC Course  I have reviewed the triage vital signs and the nursing notes.  Pertinent labs & imaging results that were available during my care of the patient were reviewed by me and considered in my medical decision making (see chart for details).  On exam, the patient's lung sounds are clear throughout, room air sats are at 96%.  The COVID test and influenza test were negative.  Symptoms are consistent with a flulike illness.  Will treat with Tamiflu  despite the negative flu test.  Symptomatic treatment provided with azelastine  nasal spray for nasal congestion and head congestion.  Supportive care recommendations were provided and discussed with the patient to include over-the-counter analgesics, fluids, rest, and use of a humidifier during sleep.  Patient was given strict follow-up precautions.  Patient was in agreement with this plan of care and verbalizes understanding.  All questions were answered.  Patient stable for discharge.  Work note was provided.  Final Clinical Impressions(s) / UC Diagnoses   Final diagnoses:  None   Discharge Instructions   None    ED Prescriptions   None    PDMP not reviewed this encounter.     [1]  Social History Tobacco Use   Smoking status: Never    Passive exposure: Yes   Smokeless  tobacco: Never  Vaping Use   Vaping status: Never Used  Substance Use Topics   Alcohol use: No   Drug  use: No     Gilmer Etta PARAS, NP 07/08/24 1555  "

## 2024-07-08 NOTE — ED Triage Notes (Signed)
 Pt reports body aches, cough, congestion  x 2 days,

## 2024-07-26 ENCOUNTER — Other Ambulatory Visit: Payer: Self-pay

## 2024-07-26 ENCOUNTER — Emergency Department (HOSPITAL_COMMUNITY)

## 2024-07-26 ENCOUNTER — Encounter (HOSPITAL_COMMUNITY): Payer: Self-pay

## 2024-07-26 ENCOUNTER — Emergency Department (HOSPITAL_COMMUNITY)
Admission: EM | Admit: 2024-07-26 | Discharge: 2024-07-26 | Disposition: A | Discharge status: Home / Self Care | Attending: Emergency Medicine | Admitting: Emergency Medicine

## 2024-07-26 DIAGNOSIS — K529 Noninfective gastroenteritis and colitis, unspecified: Secondary | ICD-10-CM | POA: Diagnosis not present

## 2024-07-26 DIAGNOSIS — R112 Nausea with vomiting, unspecified: Secondary | ICD-10-CM

## 2024-07-26 LAB — COMPREHENSIVE METABOLIC PANEL WITH GFR
ALT: 39 U/L (ref 0–44)
AST: 30 U/L (ref 15–41)
Albumin: 4.8 g/dL (ref 3.5–5.0)
Alkaline Phosphatase: 82 U/L (ref 38–126)
Anion gap: 15 (ref 5–15)
BUN: 16 mg/dL (ref 6–20)
CO2: 23 mmol/L (ref 22–32)
Calcium: 9.5 mg/dL (ref 8.9–10.3)
Chloride: 100 mmol/L (ref 98–111)
Creatinine, Ser: 1.04 mg/dL (ref 0.61–1.24)
GFR, Estimated: >60 mL/min
Glucose, Bld: 142 mg/dL — ABNORMAL HIGH (ref 70–99)
Potassium: 4.2 mmol/L (ref 3.5–5.1)
Sodium: 138 mmol/L (ref 135–145)
Total Bilirubin: 0.9 mg/dL (ref 0.0–1.2)
Total Protein: 8.7 g/dL — ABNORMAL HIGH (ref 6.5–8.1)

## 2024-07-26 LAB — CBC WITH DIFFERENTIAL/PLATELET
Abs Immature Granulocytes: 0.06 10*3/uL (ref 0.00–0.07)
Basophils Absolute: 0 10*3/uL (ref 0.0–0.1)
Basophils Relative: 0 %
Eosinophils Absolute: 0.1 10*3/uL (ref 0.0–0.5)
Eosinophils Relative: 0 %
HCT: 47 % (ref 39.0–52.0)
Hemoglobin: 15.7 g/dL (ref 13.0–17.0)
Immature Granulocytes: 0 %
Lymphocytes Relative: 4 %
Lymphs Abs: 0.6 10*3/uL — ABNORMAL LOW (ref 0.7–4.0)
MCH: 28.9 pg (ref 26.0–34.0)
MCHC: 33.4 g/dL (ref 30.0–36.0)
MCV: 86.6 fL (ref 80.0–100.0)
Monocytes Absolute: 0.8 10*3/uL (ref 0.1–1.0)
Monocytes Relative: 5 %
Neutro Abs: 13.5 10*3/uL — ABNORMAL HIGH (ref 1.7–7.7)
Neutrophils Relative %: 91 %
Platelets: 304 10*3/uL (ref 150–400)
RBC: 5.43 MIL/uL (ref 4.22–5.81)
RDW: 12.8 % (ref 11.5–15.5)
WBC: 15.1 10*3/uL — ABNORMAL HIGH (ref 4.0–10.5)
nRBC: 0 % (ref 0.0–0.2)

## 2024-07-26 LAB — RESP PANEL BY RT-PCR (RSV, FLU A&B, COVID)  RVPGX2
Influenza A by PCR: NEGATIVE
Influenza B by PCR: NEGATIVE
Resp Syncytial Virus by PCR: NEGATIVE
SARS Coronavirus 2 by RT PCR: NEGATIVE

## 2024-07-26 MED ORDER — SODIUM CHLORIDE 0.9 % IV BOLUS
1000.0000 mL | Freq: Once | INTRAVENOUS | Status: AC
  Administered 2024-07-26: 1000 mL via INTRAVENOUS

## 2024-07-26 MED ORDER — ACETAMINOPHEN 325 MG PO TABS: 650.0000 mg | ORAL_TABLET | Freq: Once | ORAL | Status: AC

## 2024-07-26 MED ORDER — ONDANSETRON HCL 4 MG/2ML IJ SOLN
4.0000 mg | Freq: Once | INTRAMUSCULAR | Status: AC
  Administered 2024-07-26: 4 mg via INTRAVENOUS
  Filled 2024-07-26: qty 2

## 2024-07-26 MED ORDER — ONDANSETRON 4 MG PO TBDP: ORAL_TABLET | ORAL | 0 refills | Status: AC

## 2024-07-26 MED ORDER — IOHEXOL 300 MG/ML  SOLN
100.0000 mL | Freq: Once | INTRAMUSCULAR | Status: AC | PRN
  Administered 2024-07-26: 100 mL via INTRAVENOUS

## 2024-07-26 MED ORDER — KETOROLAC TROMETHAMINE 30 MG/ML IJ SOLN
30.0000 mg | Freq: Once | INTRAMUSCULAR | Status: AC
  Administered 2024-07-26: 30 mg via INTRAVENOUS
  Filled 2024-07-26: qty 1

## 2024-07-26 MED ORDER — ACETAMINOPHEN 325 MG PO TABS
ORAL_TABLET | ORAL | Status: AC
  Administered 2024-07-26: 650 mg via ORAL
  Filled 2024-07-26: qty 2

## 2024-07-26 MED ORDER — DICYCLOMINE HCL 20 MG PO TABS: ORAL_TABLET | ORAL | 0 refills | Status: AC

## 2024-07-26 NOTE — ED Notes (Signed)
 See triage notes. Pt c/o abd cramping. Nad. Mm moist. Family has been sick.

## 2024-07-26 NOTE — ED Provider Notes (Signed)
 " [place] [facility] AT [facility] Provider Note   CSN: [id number] Arrival date & time: 07/26/24  9372     Patient presents with: Emesis   Anthony Wu is a 24 y.o. male.  {Add pertinent medical, surgical, social history, OB history to YEP:67052} Patient complains of some abdominal cramping nausea and vomiting and diarrhea.   Emesis      Prior to Admission medications  Medication Sig Start Date End Date Taking? Authorizing Provider  azelastine  (ASTELIN ) 0.1 % nasal spray Place 2 sprays into both nostrils 2 (two) times daily. Use in each nostril as directed 07/08/24   Leath-Warren, Etta PARAS, NP  fluticasone  (FLONASE ) 50 MCG/ACT nasal spray Place 2 sprays into both nostrils daily. 06/07/23   Leath-Warren, Etta PARAS, NP  oseltamivir  (TAMIFLU ) 75 MG capsule Take 1 capsule (75 mg total) by mouth every 12 (twelve) hours. 07/08/24   Leath-Warren, Etta PARAS, NP    Allergies: Patient has no known allergies.    Review of Systems  Gastrointestinal:  Positive for vomiting.    Updated Vital Signs BP (!) 109/56   Pulse (!) 112   Temp 98.2 F (36.8 C) (Oral)   Resp 18   Ht 5' 7 (1.702 m)   Wt 106.1 kg   SpO2 95%   BMI 36.64 kg/m   Physical Exam  (all labs ordered are listed, but only abnormal results are displayed) Labs Reviewed  CBC WITH DIFFERENTIAL/PLATELET - Abnormal; Notable for the following components:      Result Value   WBC 15.1 (*)    Neutro Abs 13.5 (*)    Lymphs Abs 0.6 (*)    All other components within normal limits  COMPREHENSIVE METABOLIC PANEL WITH GFR - Abnormal; Notable for the following components:   Glucose, Bld 142 (*)    Total Protein 8.7 (*)    All other components within normal limits  RESP PANEL BY RT-PCR (RSV, FLU A&B, COVID)  [id number]    EKG: None  Radiology: CT ABDOMEN PELVIS W CONTRAST Result Date: 07/26/2024 EXAM: CT ABDOMEN AND PELVIS WITH CONTRAST 07/26/2024 12:24:16 PM TECHNIQUE: CT of the abdomen and  pelvis was performed with the administration of 100 mL of iohexol  (OMNIPAQUE ) 300 MG/ML solution. Multiplanar reformatted images are provided for review. Automated exposure control, iterative reconstruction, and/or weight-based adjustment of the mA/kV was utilized to reduce the radiation dose to as low as reasonably achievable. COMPARISON: 09/10/2009 CLINICAL HISTORY: Abdominal pain, acute, nonlocalized. Acute, nonlocalized abdominal pain. FINDINGS: LOWER CHEST: No acute abnormality. LIVER: The liver is unremarkable. GALLBLADDER AND BILE DUCTS: Gallbladder is unremarkable. No biliary ductal dilatation. SPLEEN: No acute abnormality. PANCREAS: No acute abnormality. ADRENAL GLANDS: No acute abnormality. KIDNEYS, URETERS AND BLADDER: No stones in the kidneys or ureters. No hydronephrosis. No perinephric or periureteral stranding. The urinary bladder is distended without focal abnormality. GI AND BOWEL: Small amount of fluid in the distal esophagus. Nondistended gas filled appendix. Stomach demonstrates no acute abnormality. There is no bowel obstruction. PERITONEUM AND RETROPERITONEUM: No ascites. No free air. No free pelvic fluid. VASCULATURE: Aorta is normal in caliber. LYMPH NODES: No lymphadenopathy. REPRODUCTIVE ORGANS: No prostatomegaly. BONES AND SOFT TISSUES: No acute osseous abnormality. No focal soft tissue abnormality. IMPRESSION: 1. Small amount of fluid in the distal esophagus, which may be due to recent vomiting or gastroesophageal reflux disease. Electronically signed by: Rogelia Myers MD 07/26/2024 01:27 PM EST RP Workstation: [id number]    {Document cardiac monitor, telemetry assessment procedure when appropriate:32947} Procedures  Medications Ordered in the ED  sodium chloride  0.9 % bolus 1,000 mL (0 mLs Intravenous Stopped 07/26/24 0902)  ondansetron  (ZOFRAN ) injection 4 mg (4 mg Intravenous Given 07/26/24 0748)  ketorolac  (TORADOL ) 30 MG/ML injection 30 mg (30 mg Intravenous Given 07/26/24  0750)  sodium chloride  0.9 % bolus 1,000 mL (0 mLs Intravenous Stopped 07/26/24 1121)  acetaminophen  (TYLENOL ) tablet 650 mg (650 mg Oral Given 07/26/24 1140)  iohexol  (OMNIPAQUE ) 300 MG/ML solution 100 mL (100 mLs Intravenous Contrast Given 07/26/24 1224)      {Click here for ABCD2, HEART and other calculators REFRESH Note before signing:1}                              Medical Decision Making Amount and/or Complexity of Data Reviewed Labs: ordered. Radiology: ordered.  Risk OTC drugs. Prescription drug management.   Patient with gastroenteritis.  He will be discharged home with Zofran  and Bentyl  and follow-up with his PCP  {Document critical care time when appropriate  Document review of labs and clinical decision tools ie CHADS2VASC2, etc  Document your independent review of radiology images and any outside records  Document your discussion with family members, caretakers and with consultants  Document social determinants of health affecting pt's care  Document your decision making why or why not admission, treatments were needed:32947:::1}   Final diagnoses:  None    ED Discharge Orders     None        "

## 2024-07-26 NOTE — ED Triage Notes (Signed)
 Pov from home cc of emesis and nausea since 4am vomited 5x.  Denies pain

## 2024-07-26 NOTE — ED Notes (Signed)
PO trial with ice chips at this time.

## 2024-07-26 NOTE — ED Notes (Signed)
 Written and verbal discharge instructions administered. Pt denies any questions or concerns. IV discontinued. Cath intact. Bleeding controlled. 2x2 and tape applied to site.
